# Patient Record
Sex: Female | Born: 1955 | Race: White | Hispanic: No | Marital: Married | Smoking: Never smoker
Health system: Southern US, Community
[De-identification: ages and names within clinical notes are randomized; demographics above are authoritative.]

## PROBLEM LIST (undated history)

## (undated) DIAGNOSIS — K219 Gastro-esophageal reflux disease without esophagitis: Secondary | ICD-10-CM

## (undated) DIAGNOSIS — K760 Fatty (change of) liver, not elsewhere classified: Secondary | ICD-10-CM

## (undated) DIAGNOSIS — M109 Gout, unspecified: Secondary | ICD-10-CM

## (undated) DIAGNOSIS — E559 Vitamin D deficiency, unspecified: Secondary | ICD-10-CM

## (undated) DIAGNOSIS — R011 Cardiac murmur, unspecified: Secondary | ICD-10-CM

## (undated) DIAGNOSIS — D126 Benign neoplasm of colon, unspecified: Secondary | ICD-10-CM

## (undated) DIAGNOSIS — I509 Heart failure, unspecified: Secondary | ICD-10-CM

## (undated) DIAGNOSIS — G43909 Migraine, unspecified, not intractable, without status migrainosus: Secondary | ICD-10-CM

## (undated) DIAGNOSIS — I351 Nonrheumatic aortic (valve) insufficiency: Secondary | ICD-10-CM

## (undated) DIAGNOSIS — F419 Anxiety disorder, unspecified: Secondary | ICD-10-CM

## (undated) DIAGNOSIS — R7303 Prediabetes: Secondary | ICD-10-CM

## (undated) DIAGNOSIS — I10 Essential (primary) hypertension: Secondary | ICD-10-CM

## (undated) DIAGNOSIS — E876 Hypokalemia: Secondary | ICD-10-CM

## (undated) DIAGNOSIS — M25473 Effusion, unspecified ankle: Secondary | ICD-10-CM

## (undated) DIAGNOSIS — M1990 Unspecified osteoarthritis, unspecified site: Secondary | ICD-10-CM

## (undated) HISTORY — DX: Effusion, unspecified ankle: M25.473

## (undated) HISTORY — DX: Unspecified osteoarthritis, unspecified site: M19.90

## (undated) HISTORY — DX: Anxiety disorder, unspecified: F41.9

## (undated) HISTORY — DX: Essential (primary) hypertension: I10

## (undated) HISTORY — DX: Prediabetes: R73.03

## (undated) HISTORY — DX: Benign neoplasm of colon, unspecified: D12.6

## (undated) HISTORY — DX: Heart failure, unspecified: I50.9

## (undated) HISTORY — DX: Hypokalemia: E87.6

## (undated) HISTORY — DX: Gout, unspecified: M10.9

## (undated) HISTORY — DX: Vitamin D deficiency, unspecified: E55.9

## (undated) HISTORY — DX: Gastro-esophageal reflux disease without esophagitis: K21.9

## (undated) HISTORY — DX: Cardiac murmur, unspecified: R01.1

## (undated) HISTORY — DX: Migraine, unspecified, not intractable, without status migrainosus: G43.909

## (undated) HISTORY — DX: Fatty (change of) liver, not elsewhere classified: K76.0

---

## 2000-02-09 HISTORY — PX: CHOLECYSTECTOMY: SHX55

## 2004-08-28 ENCOUNTER — Ambulatory Visit: Payer: Self-pay | Admitting: Family Medicine

## 2004-11-06 ENCOUNTER — Ambulatory Visit: Payer: Self-pay | Admitting: Family Medicine

## 2005-09-16 ENCOUNTER — Ambulatory Visit: Payer: Self-pay | Admitting: Family Medicine

## 2006-03-30 ENCOUNTER — Ambulatory Visit: Payer: Self-pay | Admitting: Gastroenterology

## 2008-03-07 ENCOUNTER — Ambulatory Visit: Payer: Self-pay | Admitting: Obstetrics and Gynecology

## 2008-03-11 ENCOUNTER — Ambulatory Visit: Payer: Self-pay | Admitting: Obstetrics and Gynecology

## 2008-03-11 HISTORY — PX: ABDOMINAL HYSTERECTOMY: SHX81

## 2009-02-26 ENCOUNTER — Ambulatory Visit: Payer: Self-pay | Admitting: Urology

## 2009-03-10 ENCOUNTER — Ambulatory Visit: Payer: Self-pay | Admitting: Urology

## 2009-03-10 ENCOUNTER — Ambulatory Visit: Payer: Self-pay

## 2012-11-26 DIAGNOSIS — I10 Essential (primary) hypertension: Secondary | ICD-10-CM | POA: Insufficient documentation

## 2013-04-06 ENCOUNTER — Ambulatory Visit: Payer: Self-pay | Admitting: Physician Assistant

## 2013-04-06 LAB — URINALYSIS, COMPLETE
Blood: NEGATIVE
Glucose,UR: NEGATIVE mg/dL (ref 0–75)
Ketone: NEGATIVE
Leukocyte Esterase: NEGATIVE
NITRITE: NEGATIVE
Ph: 6 (ref 4.5–8.0)
Protein: NEGATIVE
Specific Gravity: >=1.03 (ref 1.003–1.030)

## 2014-11-28 DIAGNOSIS — I119 Hypertensive heart disease without heart failure: Secondary | ICD-10-CM | POA: Insufficient documentation

## 2015-03-06 DIAGNOSIS — Z86010 Personal history of colonic polyps: Secondary | ICD-10-CM | POA: Insufficient documentation

## 2015-03-06 DIAGNOSIS — Z860101 Personal history of adenomatous and serrated colon polyps: Secondary | ICD-10-CM | POA: Insufficient documentation

## 2015-09-17 DIAGNOSIS — K648 Other hemorrhoids: Secondary | ICD-10-CM | POA: Insufficient documentation

## 2015-09-17 DIAGNOSIS — N816 Rectocele: Secondary | ICD-10-CM | POA: Insufficient documentation

## 2015-09-17 HISTORY — DX: Other hemorrhoids: K64.8

## 2016-04-20 DIAGNOSIS — K5904 Chronic idiopathic constipation: Secondary | ICD-10-CM | POA: Insufficient documentation

## 2016-04-29 DIAGNOSIS — R3121 Asymptomatic microscopic hematuria: Secondary | ICD-10-CM | POA: Insufficient documentation

## 2018-01-11 ENCOUNTER — Other Ambulatory Visit: Payer: Self-pay

## 2018-01-26 ENCOUNTER — Encounter: Payer: Self-pay | Admitting: Vascular Surgery

## 2018-03-09 DIAGNOSIS — E782 Mixed hyperlipidemia: Secondary | ICD-10-CM | POA: Insufficient documentation

## 2018-12-10 HISTORY — PX: ANUS SURGERY: SHX302

## 2020-09-16 DIAGNOSIS — Z803 Family history of malignant neoplasm of breast: Secondary | ICD-10-CM | POA: Insufficient documentation

## 2020-09-16 DIAGNOSIS — G43109 Migraine with aura, not intractable, without status migrainosus: Secondary | ICD-10-CM | POA: Insufficient documentation

## 2021-01-20 LAB — HM COLONOSCOPY

## 2021-05-22 DIAGNOSIS — M47812 Spondylosis without myelopathy or radiculopathy, cervical region: Secondary | ICD-10-CM | POA: Insufficient documentation

## 2021-05-25 ENCOUNTER — Other Ambulatory Visit: Payer: Self-pay | Admitting: Surgery

## 2021-05-25 DIAGNOSIS — M7581 Other shoulder lesions, right shoulder: Secondary | ICD-10-CM

## 2021-05-25 DIAGNOSIS — M25511 Pain in right shoulder: Secondary | ICD-10-CM

## 2021-05-25 DIAGNOSIS — M75111 Incomplete rotator cuff tear or rupture of right shoulder, not specified as traumatic: Secondary | ICD-10-CM

## 2021-06-01 ENCOUNTER — Other Ambulatory Visit: Payer: Self-pay | Admitting: Surgery

## 2021-06-01 DIAGNOSIS — M47812 Spondylosis without myelopathy or radiculopathy, cervical region: Secondary | ICD-10-CM

## 2021-06-01 DIAGNOSIS — M7581 Other shoulder lesions, right shoulder: Secondary | ICD-10-CM

## 2021-06-05 ENCOUNTER — Other Ambulatory Visit: Payer: Self-pay

## 2021-06-06 ENCOUNTER — Other Ambulatory Visit: Payer: Self-pay

## 2021-06-06 ENCOUNTER — Other Ambulatory Visit: Payer: BC Managed Care – PPO

## 2021-06-12 ENCOUNTER — Ambulatory Visit
Admission: RE | Admit: 2021-06-12 | Discharge: 2021-06-12 | Disposition: A | Discharge status: Home / Self Care | Payer: BC Managed Care – PPO | Source: Ambulatory Visit | Attending: Surgery | Admitting: Surgery

## 2021-06-12 ENCOUNTER — Ambulatory Visit
Admission: RE | Admit: 2021-06-12 | Discharge: 2021-06-12 | Disposition: A | Discharge status: Home / Self Care | Payer: Self-pay | Source: Ambulatory Visit | Attending: Surgery | Admitting: Surgery

## 2021-06-12 DIAGNOSIS — M7581 Other shoulder lesions, right shoulder: Secondary | ICD-10-CM

## 2021-06-12 DIAGNOSIS — M75111 Incomplete rotator cuff tear or rupture of right shoulder, not specified as traumatic: Secondary | ICD-10-CM

## 2021-06-12 DIAGNOSIS — M25511 Pain in right shoulder: Secondary | ICD-10-CM

## 2021-06-12 DIAGNOSIS — M47812 Spondylosis without myelopathy or radiculopathy, cervical region: Secondary | ICD-10-CM

## 2021-09-11 ENCOUNTER — Other Ambulatory Visit: Payer: Self-pay | Admitting: Surgery

## 2021-10-05 ENCOUNTER — Other Ambulatory Visit: Payer: BC Managed Care – PPO

## 2021-10-22 LAB — HM MAMMOGRAPHY

## 2021-11-17 ENCOUNTER — Telehealth: Payer: Self-pay

## 2021-11-17 NOTE — Telephone Encounter (Signed)
Noted  KP 

## 2021-11-17 NOTE — Telephone Encounter (Signed)
Copied from [place] [phone]. Topic: Appointment Scheduling - Scheduling Inquiry for Clinic >> Nov 17, 2021  9:05 AM Tiffany B wrote: Reason for CRM: Patient would like to know if PCP can possible work her in prior to her rotator cuff surgery with Poggi, Marshall Cork, MD at [facility] clinic scheduled for 02/11/2021.   Patient was referred by an exisiting patient of Dr. Army Melia. Inform patient she is on the wait list and if there's any cancellations we will reach out.

## 2021-12-14 ENCOUNTER — Ambulatory Visit: Payer: Self-pay

## 2021-12-18 LAB — BASIC METABOLIC PANEL
BUN: 16 (ref 4–21)
CO2: 33 — AB (ref 13–22)
Chloride: 101 (ref 99–108)
Creatinine: 0.8 (ref 0.5–1.1)
Glucose: 95
Potassium: 3.6 mEq/L (ref 3.5–5.1)
Sodium: 141 (ref 137–147)

## 2021-12-18 LAB — COMPREHENSIVE METABOLIC PANEL
Calcium: 10 (ref 8.7–10.7)
eGFR: 84

## 2022-01-21 ENCOUNTER — Other Ambulatory Visit: Payer: Self-pay | Admitting: Surgery

## 2022-02-03 ENCOUNTER — Encounter: Payer: Self-pay | Admitting: *Deleted

## 2022-02-03 ENCOUNTER — Encounter
Admission: RE | Admit: 2022-02-03 | Discharge: 2022-02-03 | Disposition: A | Discharge status: Home / Self Care | Payer: BC Managed Care – PPO | Source: Ambulatory Visit | Attending: Surgery | Admitting: Surgery

## 2022-02-03 NOTE — Patient Instructions (Addendum)
Your procedure is scheduled on: Thursday February 11, 2022. Report to Day Surgery inside [facility] 2nd floor, stop by registration desk before getting on elevator.  To find out your arrival time please call [phone] between 1PM - 3PM on Wednesday February 10, 2022.  Remember: Instructions that are not followed completely may result in serious medical risk,  up to and including death, or upon the discretion of your surgeon and anesthesiologist your  surgery may need to be rescheduled.     _X__ 1. Do not eat food after midnight the night before your procedure.                 No chewing gum or hard candies. You may drink clear liquids up to 2 hours                 before you are scheduled to arrive for your surgery- DO not drink clear                 liquids within 2 hours of the start of your surgery.                 Clear Liquids include:  water, apple juice without pulp, Black Coffee or Tea (Do not add                 anything to coffee or tea).  __X__2.   Complete the "Ensure Clear Pre-surgery Clear Carbohydrate Drink" provided to you, 2 hours before arrival. **If you are diabetic you will be provided with an alternative drink, Gatorade Zero or G2.  __X__3.  On the morning of surgery brush your teeth with toothpaste and water, you                may rinse your mouth with mouthwash if you wish.  Do not swallow any toothpaste or mouthwash.     _X__ 4.  No Alcohol for 24 hours before or after surgery.   _X__ 5.  Do Not Smoke or use e-cigarettes For 24 Hours Prior to Your Surgery.                 Do not use any chewable tobacco products for at least 6 hours prior to                 Surgery.  _X__  6.  Do not use any recreational drugs (marijuana, cocaine, heroin, ecstasy, MDMA or other)                For at least one week prior to your surgery.  Combination of these drugs with anesthesia                May have life threatening results.  ____  7.  Bring all  medications with you on the day of surgery if instructed.   __X__ 8.  Notify your doctor if there is any change in your medical condition      (cold, fever, infections).     Do not wear jewelry, make-up, hairpins, clips or nail polish. Do not wear lotions, powders, or perfumes.  Do not shave 48 hours prior to surgery.  Do not bring valuables to the hospital.    [facility] is not responsible for any belongings or valuables.  Contacts, dentures or bridgework may not be worn into surgery. Leave your suitcase in the car. After surgery it may be brought to your room. For patients admitted to the hospital, discharge time  is determined by your treatment team.   Patients discharged the day of surgery will not be allowed to drive home.   Make arrangements for someone to be with you for the first 24 hours of your Same Day Discharge.   _X___ Take these medicines the morning of surgery with A SIP OF WATER:    1. None    2.   3.   4.  5.  6.  ____ Fleet Enema (as directed)   __X__ Use CHG Soap (or wipes) as directed  ____ Use Benzoyl Peroxide Gel as instructed  ____ Use inhalers on the day of surgery  ____ Stop metformin 2 days prior to surgery    ____ Take 1/2 of usual insulin dose the night before surgery. No insulin the morning          of surgery.   ____ Call your PCP, cardiologist, or Pulmonologist if taking Coumadin/Plavix/aspirin and ask when to stop before your surgery.   __X__ One Week prior to surgery- Stop Anti-inflammatories such as Ibuprofen, Aleve, Advil, Motrin, meloxicam (MOBIC), diclofenac, etodolac, ketorolac, Toradol, Daypro, piroxicam, Goody's or BC powders. OK TO USE TYLENOL IF NEEDED   __X__ Stop vitamins and supplements until after surgery.    ____ Bring C-Pap to the hospital.    If you have any questions regarding your pre-procedure instructions,  Please call Pre-admit Testing at [phone]    Preparing for Surgery with CHLORHEXIDINE GLUCONATE  (CHG) Soap  Chlorhexidine Gluconate (CHG) Soap  o An antiseptic cleaner that kills germs and bonds with the skin to continue killing germs even after washing  o Used for showering the night before surgery and morning of surgery  Before surgery, you can play an important role by reducing the number of germs on your skin.  CHG (Chlorhexidine gluconate) soap is an antiseptic cleanser which kills germs and bonds with the skin to continue killing germs even after washing.  Please do not use if you have an allergy to CHG or antibacterial soaps. If your skin becomes reddened/irritated stop using the CHG.  1. Shower the NIGHT BEFORE SURGERY and the MORNING OF SURGERY with CHG soap.  2. If you choose to wash your hair, wash your hair first as usual with your normal shampoo.  3. After shampooing, rinse your hair and body thoroughly to remove the shampoo.  4. Use CHG as you would any other liquid soap. You can apply CHG directly to the skin and wash gently with a scrungie or a clean washcloth.  5. Apply the CHG soap to your body only from the neck down. Do not use on open wounds or open sores. Avoid contact with your eyes, ears, mouth, and genitals (private parts). Wash face and genitals (private parts) with your normal soap.  6. Wash thoroughly, paying special attention to the area where your surgery will be performed.  7. Thoroughly rinse your body with warm water.  8. Do not shower/wash with your normal soap after using and rinsing off the CHG soap.  9. Pat yourself dry with a clean towel.  10. Wear clean pajamas to bed the night before surgery.  12. Place clean sheets on your bed the night of your first shower and do not sleep with pets.  13. Shower again with the CHG soap on the day of surgery prior to arriving at the hospital.  14. Do not apply any deodorants/lotions/powders.  15. Please wear clean clothes to the hospital.

## 2022-02-10 MED ORDER — LACTATED RINGERS IV SOLN: INTRAVENOUS | Status: DC

## 2022-02-10 MED ORDER — ORAL CARE MOUTH RINSE: 15.0000 mL | Freq: Once | OROMUCOSAL | Status: AC

## 2022-02-10 MED ORDER — CEFAZOLIN SODIUM-DEXTROSE 2-4 GM/100ML-% IV SOLN: 2.0000 g | INTRAVENOUS | Status: DC

## 2022-02-10 MED ORDER — CHLORHEXIDINE GLUCONATE 0.12 % MT SOLN: 15.0000 mL | Freq: Once | OROMUCOSAL | Status: AC

## 2022-02-10 MED ORDER — FAMOTIDINE 20 MG PO TABS: 20.0000 mg | ORAL_TABLET | Freq: Once | ORAL | Status: AC

## 2022-02-11 ENCOUNTER — Other Ambulatory Visit: Payer: Self-pay

## 2022-02-11 ENCOUNTER — Encounter
Admission: RE | Disposition: A | Discharge status: Home / Self Care | Payer: Self-pay | Source: Home / Self Care | Attending: Surgery

## 2022-02-11 ENCOUNTER — Encounter: Payer: Self-pay | Admitting: Surgery

## 2022-02-11 ENCOUNTER — Ambulatory Visit: Payer: PPO

## 2022-02-11 ENCOUNTER — Ambulatory Visit: Payer: PPO | Admitting: Anesthesiology

## 2022-02-11 ENCOUNTER — Ambulatory Visit
Admission: RE | Admit: 2022-02-11 | Discharge: 2022-02-11 | Disposition: A | Discharge status: Home / Self Care | Payer: PPO | Attending: Surgery | Admitting: Surgery

## 2022-02-11 DIAGNOSIS — K760 Fatty (change of) liver, not elsewhere classified: Secondary | ICD-10-CM | POA: Insufficient documentation

## 2022-02-11 DIAGNOSIS — M7521 Bicipital tendinitis, right shoulder: Secondary | ICD-10-CM | POA: Insufficient documentation

## 2022-02-11 DIAGNOSIS — Z6833 Body mass index (BMI) 33.0-33.9, adult: Secondary | ICD-10-CM | POA: Diagnosis not present

## 2022-02-11 DIAGNOSIS — E669 Obesity, unspecified: Secondary | ICD-10-CM | POA: Diagnosis not present

## 2022-02-11 DIAGNOSIS — I110 Hypertensive heart disease with heart failure: Secondary | ICD-10-CM | POA: Diagnosis not present

## 2022-02-11 DIAGNOSIS — M75111 Incomplete rotator cuff tear or rupture of right shoulder, not specified as traumatic: Secondary | ICD-10-CM | POA: Insufficient documentation

## 2022-02-11 DIAGNOSIS — M25811 Other specified joint disorders, right shoulder: Secondary | ICD-10-CM | POA: Diagnosis not present

## 2022-02-11 DIAGNOSIS — M7581 Other shoulder lesions, right shoulder: Secondary | ICD-10-CM | POA: Diagnosis not present

## 2022-02-11 DIAGNOSIS — S43431A Superior glenoid labrum lesion of right shoulder, initial encounter: Secondary | ICD-10-CM | POA: Insufficient documentation

## 2022-02-11 DIAGNOSIS — K219 Gastro-esophageal reflux disease without esophagitis: Secondary | ICD-10-CM | POA: Diagnosis not present

## 2022-02-11 DIAGNOSIS — I509 Heart failure, unspecified: Secondary | ICD-10-CM | POA: Diagnosis not present

## 2022-02-11 DIAGNOSIS — G8918 Other acute postprocedural pain: Secondary | ICD-10-CM | POA: Diagnosis not present

## 2022-02-11 HISTORY — PX: SHOULDER ARTHROSCOPY WITH SUBACROMIAL DECOMPRESSION, ROTATOR CUFF REPAIR AND BICEP TENDON REPAIR: SHX5687

## 2022-02-11 SURGERY — SHOULDER ARTHROSCOPY WITH SUBACROMIAL DECOMPRESSION, ROTATOR CUFF REPAIR AND BICEP TENDON REPAIR
Anesthesia: Regional | Site: Shoulder | Laterality: Right

## 2022-02-11 MED ORDER — LIDOCAINE HCL (PF) 2 % IJ SOLN
INTRAMUSCULAR | Status: AC
  Filled 2022-02-11: qty 5

## 2022-02-11 MED ORDER — SODIUM CHLORIDE 0.9 % IV SOLN: INTRAVENOUS | Status: DC

## 2022-02-11 MED ORDER — LACTATED RINGERS IV SOLN: INTRAVENOUS | Status: DC

## 2022-02-11 MED ORDER — FENTANYL CITRATE PF 50 MCG/ML IJ SOSY
PREFILLED_SYRINGE | INTRAMUSCULAR | Status: AC
  Administered 2022-02-11: 50 ug via INTRAVENOUS
  Filled 2022-02-11: qty 1

## 2022-02-11 MED ORDER — PHENYLEPHRINE HCL-NACL 20-0.9 MG/250ML-% IV SOLN
INTRAVENOUS | Status: DC | PRN
  Administered 2022-02-11: 20 ug/min via INTRAVENOUS

## 2022-02-11 MED ORDER — KETOROLAC TROMETHAMINE 15 MG/ML IJ SOLN: 15.0000 mg | Freq: Once | INTRAMUSCULAR | Status: DC

## 2022-02-11 MED ORDER — OXYCODONE HCL 5 MG PO TABS
5.0000 mg | ORAL_TABLET | Freq: Once | ORAL | Status: AC | PRN
  Administered 2022-02-11: 5 mg via ORAL

## 2022-02-11 MED ORDER — FENTANYL CITRATE (PF) 100 MCG/2ML IJ SOLN: 25.0000 ug | INTRAMUSCULAR | Status: DC | PRN

## 2022-02-11 MED ORDER — ROCURONIUM BROMIDE 100 MG/10ML IV SOLN
INTRAVENOUS | Status: DC | PRN
  Administered 2022-02-11: 50 mg via INTRAVENOUS

## 2022-02-11 MED ORDER — ONDANSETRON HCL 4 MG/2ML IJ SOLN: 4.0000 mg | Freq: Four times a day (QID) | INTRAMUSCULAR | Status: DC | PRN

## 2022-02-11 MED ORDER — ACETAMINOPHEN 10 MG/ML IV SOLN: 1000.0000 mg | Freq: Once | INTRAVENOUS | Status: DC | PRN

## 2022-02-11 MED ORDER — CEFAZOLIN SODIUM-DEXTROSE 2-4 GM/100ML-% IV SOLN
INTRAVENOUS | Status: AC
  Filled 2022-02-11: qty 100

## 2022-02-11 MED ORDER — BUPIVACAINE HCL (PF) 0.5 % IJ SOLN
INTRAMUSCULAR | Status: AC
  Filled 2022-02-11: qty 10

## 2022-02-11 MED ORDER — EPINEPHRINE PF 1 MG/ML IJ SOLN
INTRAMUSCULAR | Status: AC
  Filled 2022-02-11: qty 2

## 2022-02-11 MED ORDER — OXYCODONE HCL 5 MG PO TABS: 5.0000 mg | ORAL_TABLET | ORAL | 0 refills | Status: DC | PRN

## 2022-02-11 MED ORDER — MIDAZOLAM HCL 2 MG/2ML IJ SOLN
INTRAMUSCULAR | Status: AC
  Administered 2022-02-11: 1 mg via INTRAVENOUS
  Filled 2022-02-11: qty 2

## 2022-02-11 MED ORDER — DEXAMETHASONE SODIUM PHOSPHATE 10 MG/ML IJ SOLN
INTRAMUSCULAR | Status: AC
  Filled 2022-02-11: qty 1

## 2022-02-11 MED ORDER — ONDANSETRON HCL 4 MG/2ML IJ SOLN
INTRAMUSCULAR | Status: DC | PRN
  Administered 2022-02-11: 4 mg via INTRAVENOUS

## 2022-02-11 MED ORDER — ONDANSETRON HCL 4 MG PO TABS: 4.0000 mg | ORAL_TABLET | Freq: Four times a day (QID) | ORAL | Status: DC | PRN

## 2022-02-11 MED ORDER — MIDAZOLAM HCL 2 MG/2ML IJ SOLN
1.0000 mg | INTRAMUSCULAR | Status: AC | PRN
  Administered 2022-02-11: 1 mg via INTRAVENOUS

## 2022-02-11 MED ORDER — KETOROLAC TROMETHAMINE 30 MG/ML IJ SOLN
INTRAMUSCULAR | Status: AC
  Filled 2022-02-11: qty 1

## 2022-02-11 MED ORDER — ACETAMINOPHEN 10 MG/ML IV SOLN
INTRAVENOUS | Status: DC | PRN
  Administered 2022-02-11: 1000 mg via INTRAVENOUS

## 2022-02-11 MED ORDER — FENTANYL CITRATE (PF) 100 MCG/2ML IJ SOLN
INTRAMUSCULAR | Status: DC | PRN
  Administered 2022-02-11: 50 ug via INTRAVENOUS

## 2022-02-11 MED ORDER — ONDANSETRON HCL 4 MG/2ML IJ SOLN
INTRAMUSCULAR | Status: AC
  Filled 2022-02-11: qty 2

## 2022-02-11 MED ORDER — ONDANSETRON 4 MG PO TBDP: 4.0000 mg | ORAL_TABLET | Freq: Three times a day (TID) | ORAL | 1 refills | Status: DC | PRN

## 2022-02-11 MED ORDER — KETOROLAC TROMETHAMINE 30 MG/ML IJ SOLN
INTRAMUSCULAR | Status: DC | PRN
  Administered 2022-02-11: 15 mg via INTRAVENOUS

## 2022-02-11 MED ORDER — PROPOFOL 10 MG/ML IV BOLUS
INTRAVENOUS | Status: AC
  Filled 2022-02-11: qty 20

## 2022-02-11 MED ORDER — OXYCODONE HCL 5 MG PO TABS
ORAL_TABLET | ORAL | Status: AC
  Filled 2022-02-11: qty 1

## 2022-02-11 MED ORDER — FENTANYL CITRATE PF 50 MCG/ML IJ SOSY: 50.0000 ug | PREFILLED_SYRINGE | Freq: Once | INTRAMUSCULAR | Status: AC

## 2022-02-11 MED ORDER — PHENYLEPHRINE HCL (PRESSORS) 10 MG/ML IV SOLN
INTRAVENOUS | Status: AC
  Filled 2022-02-11: qty 1

## 2022-02-11 MED ORDER — SUGAMMADEX SODIUM 200 MG/2ML IV SOLN
INTRAVENOUS | Status: DC | PRN
  Administered 2022-02-11: 200 mg via INTRAVENOUS

## 2022-02-11 MED ORDER — METOCLOPRAMIDE HCL 5 MG/ML IJ SOLN: 5.0000 mg | Freq: Three times a day (TID) | INTRAMUSCULAR | Status: DC | PRN

## 2022-02-11 MED ORDER — ONDANSETRON HCL 4 MG/2ML IJ SOLN
4.0000 mg | Freq: Once | INTRAMUSCULAR | Status: AC | PRN
  Administered 2022-02-11: 4 mg via INTRAVENOUS

## 2022-02-11 MED ORDER — DEXAMETHASONE SODIUM PHOSPHATE 10 MG/ML IJ SOLN
INTRAMUSCULAR | Status: DC | PRN
  Administered 2022-02-11: 10 mg via INTRAVENOUS

## 2022-02-11 MED ORDER — BUPIVACAINE HCL (PF) 0.5 % IJ SOLN
INTRAMUSCULAR | Status: DC | PRN
  Administered 2022-02-11: 10 mL

## 2022-02-11 MED ORDER — METOCLOPRAMIDE HCL 10 MG PO TABS: 5.0000 mg | ORAL_TABLET | Freq: Three times a day (TID) | ORAL | Status: DC | PRN

## 2022-02-11 MED ORDER — GLYCOPYRROLATE 0.2 MG/ML IJ SOLN
INTRAMUSCULAR | Status: DC | PRN
  Administered 2022-02-11: .2 mg via INTRAVENOUS

## 2022-02-11 MED ORDER — OXYCODONE HCL 5 MG PO TABS: 5.0000 mg | ORAL_TABLET | ORAL | Status: DC | PRN

## 2022-02-11 MED ORDER — LACTATED RINGERS IV SOLN
INTRAVENOUS | Status: DC | PRN
  Administered 2022-02-11: 2501 mL

## 2022-02-11 MED ORDER — BUPIVACAINE LIPOSOME 1.3 % IJ SUSP
INTRAMUSCULAR | Status: DC | PRN
  Administered 2022-02-11: 20 mL

## 2022-02-11 MED ORDER — CHLORHEXIDINE GLUCONATE 0.12 % MT SOLN
OROMUCOSAL | Status: AC
  Administered 2022-02-11: 15 mL via OROMUCOSAL
  Filled 2022-02-11: qty 15

## 2022-02-11 MED ORDER — FAMOTIDINE 20 MG PO TABS
ORAL_TABLET | ORAL | Status: AC
  Administered 2022-02-11: 20 mg via ORAL
  Filled 2022-02-11: qty 1

## 2022-02-11 MED ORDER — CEFAZOLIN SODIUM-DEXTROSE 2-4 GM/100ML-% IV SOLN
2.0000 g | INTRAVENOUS | Status: AC
  Administered 2022-02-11: 2 g via INTRAVENOUS

## 2022-02-11 MED ORDER — EPHEDRINE SULFATE (PRESSORS) 50 MG/ML IJ SOLN
INTRAMUSCULAR | Status: DC | PRN
  Administered 2022-02-11: 5 mg via INTRAVENOUS
  Administered 2022-02-11 (×2): 10 mg via INTRAVENOUS

## 2022-02-11 MED ORDER — OXYCODONE HCL 5 MG/5ML PO SOLN: 5.0000 mg | Freq: Once | ORAL | Status: AC | PRN

## 2022-02-11 MED ORDER — LIDOCAINE HCL (CARDIAC) PF 100 MG/5ML IV SOSY
PREFILLED_SYRINGE | INTRAVENOUS | Status: DC | PRN
  Administered 2022-02-11: 100 mg via INTRAVENOUS

## 2022-02-11 MED ORDER — BUPIVACAINE LIPOSOME 1.3 % IJ SUSP
INTRAMUSCULAR | Status: AC
  Filled 2022-02-11: qty 20

## 2022-02-11 MED ORDER — BUPIVACAINE-EPINEPHRINE 0.5% -1:200000 IJ SOLN
INTRAMUSCULAR | Status: DC | PRN
  Administered 2022-02-11: 30 mL

## 2022-02-11 MED ORDER — FENTANYL CITRATE (PF) 100 MCG/2ML IJ SOLN
INTRAMUSCULAR | Status: AC
  Filled 2022-02-11: qty 2

## 2022-02-11 MED ORDER — PROPOFOL 10 MG/ML IV BOLUS
INTRAVENOUS | Status: DC | PRN
  Administered 2022-02-11: 150 mg via INTRAVENOUS

## 2022-02-11 MED ORDER — ROCURONIUM BROMIDE 10 MG/ML (PF) SYRINGE
PREFILLED_SYRINGE | INTRAVENOUS | Status: AC
  Filled 2022-02-11: qty 10

## 2022-02-11 MED ORDER — EPHEDRINE 5 MG/ML INJ
INTRAVENOUS | Status: AC
  Filled 2022-02-11: qty 10

## 2022-02-11 MED ORDER — BUPIVACAINE-EPINEPHRINE (PF) 0.5% -1:200000 IJ SOLN
INTRAMUSCULAR | Status: AC
  Filled 2022-02-11: qty 30

## 2022-02-11 SURGICAL SUPPLY — 56 items
ANCH SUT 2 JK 1.5X2.9 2 LD (Anchor) ×2 IMPLANT
ANCH SUT 5.5 KNTLS (Anchor) ×1 IMPLANT
ANCHOR HEALICOIL REGEN 5.5 (Anchor) IMPLANT
ANCHOR SUT JK SZ 2 2.9 DBL SL (Anchor) IMPLANT
APL PRP STRL LF DISP 70% ISPRP (MISCELLANEOUS) ×2
BIT DRILL JUGRKNT W/NDL BIT2.9 (DRILL) IMPLANT
BLADE FULL RADIUS 3.5 (BLADE) ×1 IMPLANT
BUR ACROMIONIZER 4.0 (BURR) ×1 IMPLANT
CANNULA SHAVER 8MMX76MM (CANNULA) ×1 IMPLANT
CHLORAPREP W/TINT 26 (MISCELLANEOUS) ×1 IMPLANT
COOLER POLAR GLACIER W/PUMP (MISCELLANEOUS) IMPLANT
COVER MAYO STAND REUSABLE (DRAPES) ×1 IMPLANT
DILATOR 5.5 THREADED HEALICOIL (MISCELLANEOUS) IMPLANT
DRILL JUGGERKNOT W/NDL BIT 2.9 (DRILL) ×1
ELECT CAUTERY BLADE 6.4 (BLADE) ×1 IMPLANT
ELECT REM PT RETURN 9FT ADLT (ELECTROSURGICAL) ×1
ELECTRODE REM PT RTRN 9FT ADLT (ELECTROSURGICAL) ×1 IMPLANT
GAUZE SPONGE 4X4 12PLY STRL (GAUZE/BANDAGES/DRESSINGS) ×1 IMPLANT
GAUZE XEROFORM 1X8 LF (GAUZE/BANDAGES/DRESSINGS) ×1 IMPLANT
GLOVE BIO SURGEON STRL SZ7.5 (GLOVE) ×2 IMPLANT
GLOVE BIO SURGEON STRL SZ8 (GLOVE) ×2 IMPLANT
GLOVE BIOGEL PI IND STRL 8 (GLOVE) ×1 IMPLANT
GLOVE SURG UNDER LTX SZ8 (GLOVE) ×1 IMPLANT
GOWN STRL REUS W/ TWL LRG LVL3 (GOWN DISPOSABLE) ×1 IMPLANT
GOWN STRL REUS W/ TWL XL LVL3 (GOWN DISPOSABLE) ×1 IMPLANT
GOWN STRL REUS W/TWL LRG LVL3 (GOWN DISPOSABLE) ×1
GOWN STRL REUS W/TWL XL LVL3 (GOWN DISPOSABLE) ×1
GRASPER SUT 15 45D LOW PRO (SUTURE) IMPLANT
IV LACTATED RINGER IRRG 3000ML (IV SOLUTION) ×1
IV LR IRRIG 3000ML ARTHROMATIC (IV SOLUTION) ×2 IMPLANT
KIT CANNULA 8X76-LX IN CANNULA (CANNULA) ×1 IMPLANT
MANIFOLD NEPTUNE II (INSTRUMENTS) ×2 IMPLANT
MASK FACE SPIDER DISP (MASK) ×1 IMPLANT
MAT ABSORB  FLUID 56X50 GRAY (MISCELLANEOUS) ×1
MAT ABSORB FLUID 56X50 GRAY (MISCELLANEOUS) ×1 IMPLANT
PACK ARTHROSCOPY SHOULDER (MISCELLANEOUS) ×1 IMPLANT
PAD ABD DERMACEA PRESS 5X9 (GAUZE/BANDAGES/DRESSINGS) ×2 IMPLANT
PAD WRAPON POLAR SHDR UNIV (MISCELLANEOUS) IMPLANT
PASSER SUT FIRSTPASS SELF (INSTRUMENTS) IMPLANT
SLEEVE REMOTE CONTROL 5X12 (DRAPES) IMPLANT
SLING ARM LRG DEEP (SOFTGOODS) ×1 IMPLANT
SLING ULTRA II LG (MISCELLANEOUS) ×1 IMPLANT
SPONGE T-LAP 18X18 ~~LOC~~+RFID (SPONGE) ×1 IMPLANT
STAPLER SKIN PROX 35W (STAPLE) ×1 IMPLANT
STRAP SAFETY 5IN WIDE (MISCELLANEOUS) ×1 IMPLANT
SUT ETHIBOND 0 MO6 C/R (SUTURE) ×1 IMPLANT
SUT ULTRABRAID 2 COBRAID 38 (SUTURE) IMPLANT
SUT VIC AB 2-0 CT1 27 (SUTURE) ×2
SUT VIC AB 2-0 CT1 TAPERPNT 27 (SUTURE) ×2 IMPLANT
TAPE MICROFOAM 4IN (TAPE) ×1 IMPLANT
TRAP FLUID SMOKE EVACUATOR (MISCELLANEOUS) ×1 IMPLANT
TUBING CONNECTING 10 (TUBING) ×1 IMPLANT
TUBING INFLOW SET DBFLO PUMP (TUBING) ×1 IMPLANT
WAND WEREWOLF FLOW 90D (MISCELLANEOUS) ×1 IMPLANT
WATER STERILE IRR 500ML POUR (IV SOLUTION) ×1 IMPLANT
WRAPON POLAR PAD SHDR UNIV (MISCELLANEOUS) ×1

## 2022-02-11 NOTE — Op Note (Signed)
02/11/2022  3:48 PM  Patient:   Lynn Tapia  Pre-Op Diagnosis:   Impingement/tendinopathy with partial-thickness rotator cuff tear, right shoulder.  Post-Op Diagnosis:   Impingement/tendinopathy with partial-thickness rotator cuff tear, SLAP tear, and biceps tendinopathy, right shoulder.  Procedure:   Extensive arthroscopic debridement, arthroscopic subacromial decompression, mini-open rotator cuff repair, and mini-open biceps tenodesis, right shoulder.  Anesthesia:   General endotracheal with interscalene block using Exparel placed preoperatively by the anesthesiologist.  Surgeon:   Pascal Lux, MD  Assistant:   Cameron Proud, PA-C  Findings:   As above. There was a near full-thickness primarily articular sided partial-thickness tear involving the anterior and middle portions of the supraspinatus insertional fibers. The remainder of the rotator cuff was in excellent condition, other than some longitudinal partial-thickness tearing involving the superior insertional fibers of the subscapularis tendon without compromise of the footprint. There was a Type II SLAP tear extending from the 11:00 to 1:30 position, as well as more chronic extensive fraying of the anterior and superior portions of the labrum. There was moderate "lip sticking" of the biceps tendon without partial or full-thickness tearing. The articular surfaces of the glenoid and humerus both were in satisfactory condition.  Complications:   None  Fluids:   650 cc  Estimated blood loss:   10 cc  Tourniquet time:   None  Drains:   None  Closure:   Staples      Brief clinical note:   The patient is a 67 year old female with a history of gradually worsening right shoulder pain. The patient's symptoms have progressed despite medications, activity modification, etc. The patient's history and examination are consistent with impingement/tendinopathy with a probable rotator cuff tear. These findings were confirmed by MRI scan.  The patient presents at this time for definitive management of these shoulder symptoms.  Procedure:   The patient underwent placement of an interscalene block using Exparel by the anesthesiologist in the preoperative holding area before being brought into the operating room and lain in the supine position. The patient then underwent general endotracheal intubation and anesthesia before being repositioned in the beach chair position using the beach chair positioner. The right shoulder and upper extremity were prepped with ChloraPrep solution before being draped sterilely. Preoperative antibiotics were administered. A timeout was performed to confirm the proper surgical site before the expected portal sites and incision site were injected with 0.5% Sensorcaine with epinephrine.   A posterior portal was created and the glenohumeral joint thoroughly inspected with the findings as described above. An anterior portal was created using an outside-in technique. The labrum and rotator cuff were further probed, again confirming the above-noted findings. The areas of labral fraying were debrided back to stable margins using the full-radius resector. The torn portions of the supraspinatus and subscapularis tendons were debrided back to stable margins using the full-radius resector. Finally, areas of synovitis anteriorly and superiorly were debrided back to stable margins using the full-radius resector. The ArthroCare wand was inserted and used to obtain hemostasis as well as to "anneal" the labrum superiorly and anteriorly. The instruments were removed from the joint after suctioning the excess fluid.  The camera was repositioned through the posterior portal into the subacromial space. A separate lateral portal was created using an outside-in technique. The 3.5 mm full-radius resector was introduced and used to perform a subtotal bursectomy. The ArthroCare wand was then inserted and used to remove the periosteal tissue  off the undersurface of the anterior third of the acromion as  well as to recess the coracoacromial ligament from its attachment along the anterior and lateral margins of the acromion. The 4.0 mm acromionizing bur was introduced and used to complete the decompression by removing the undersurface of the anterior third of the acromion. The full radius resector was reintroduced to remove any residual bony debris before the ArthroCare wand was reintroduced to obtain hemostasis. The instruments were then removed from the subacromial space after suctioning the excess fluid.  An approximately 4-5 cm incision was made over the anterolateral aspect of the shoulder beginning at the anterolateral corner of the acromion and extending distally in line with the bicipital groove. This incision was carried down through the subcutaneous tissues to expose the deltoid fascia. The raphae between the anterior and middle thirds was identified and this plane developed to provide access into the subacromial space. Additional bursal tissues were debrided sharply using Metzenbaum scissors. The rotator cuff tear involving the supraspinatus tendon was readily identified, both visually and by palpation. The tear was completed before the margins were debrided sharply with a #15 blade and the exposed greater tuberosity roughened with a rongeur. The tear was repaired using a single Biomet 2.9 mm JuggerKnot anchor. These sutures were then brought back laterally and secured using one [place] knotless RegeneSorb anchor to create a two-layer closure. An apparent watertight closure was obtained.  The bicipital groove was identified by palpation and opened for 1-1.5 cm. The biceps tendon stump was retrieved through this defect. The floor of the bicipital groove was roughened with a curet before another Biomet 2.9 mm JuggerKnot anchor was inserted. Both sets of sutures were passed through the biceps tendon and tied securely to effect  the tenodesis. The bicipital sheath was reapproximated using two #0 Ethibond interrupted sutures, incorporating the biceps tendon to further reinforce the tenodesis.  The wound was copiously irrigated with sterile saline solution before the deltoid raphae was reapproximated using 2-0 Vicryl interrupted sutures. The subcutaneous tissues were closed in two layers using 2-0 Vicryl interrupted sutures before the skin was closed using staples. The portal sites also were closed using staples. A sterile bulky dressing was applied to the shoulder before the arm was placed into a shoulder immobilizer. A Polar Care device was applied to the shoulder. The patient was then awakened, extubated, and returned to the recovery room in satisfactory condition after tolerating the procedure well.

## 2022-02-11 NOTE — Anesthesia Procedure Notes (Addendum)
Anesthesia Regional Block: Interscalene brachial plexus block   Pre-Anesthetic Checklist: , timeout performed,  Correct Patient, Correct Site, Correct Laterality,  Correct Procedure,, risks and benefits discussed,  Surgical consent,  Pre-op evaluation,  At surgeon's request and post-op pain management  Laterality: Right  Prep: chloraprep       Needles:  Injection technique: Single-shot  Needle Type: Echogenic Needle          Additional Needles:   Procedures:,,,, ultrasound used (permanent image in chart),,   Motor weakness within 20 minutes.  Narrative:  Start time: 02/11/2022 1:03 PM End time: 02/11/2022 1:05 PM Injection made incrementally with aspirations every 5 mL.  Performed by: Personally  Anesthesiologist: Darrin Nipper, MD  Additional Notes: Functioning IV was confirmed and monitors applied.  Sterile prep and drape, hand hygiene and sterile gloves were used. Ultrasound guidance: relevant anatomy identified, needle position confirmed, local anesthetic spread visualized around nerve(s), vascular puncture avoided.  Image saved to electronic medical record.  Negative aspiration prior to incremental administration of local anesthetic for total 20 ml Exparel and 10 ml bupivacaine 0.5% given in interscalene distribution. The patient tolerated the procedure well. Vital signs and moderate sedation medications recorded in RN notes.   Update 02/11/22 at 13:36 -- pt still able to lift arm above head.  Failed block for unknown reason -- anatomy was straightforward.

## 2022-02-11 NOTE — Discharge Instructions (Addendum)
Orthopedic discharge instructions: Keep dressing dry and intact.  May shower after dressing changed on post-op day #4 (Monday).  Cover staples with Band-Aids after drying off. Apply ice frequently to shoulder. Take ibuprofen 600-800 mg TID with meals for 3-5 days, then as necessary. Take pain medication as prescribed when needed.  May supplement with ES Tylenol if necessary. Keep shoulder immobilizer on at all times except may remove for bathing purposes. Follow-up in 10-14 days or as scheduled.AMBULATORY SURGERY  DISCHARGE INSTRUCTIONS   The drugs that you were given will stay in your system until tomorrow so for the next 24 hours you should not:  Drive an automobile Make any legal decisions Drink any alcoholic beverage   You may resume regular meals tomorrow.  Today it is better to start with liquids and gradually work up to solid foods.  You may eat anything you prefer, but it is better to start with liquids, then soup and crackers, and gradually work up to solid foods.   Please notify your doctor immediately if you have any unusual bleeding, trouble breathing, redness and pain at the surgery site, drainage, fever, or pain not relieved by medication.    Additional Instructions:        Please contact your physician with any problems or Same Day Surgery at [phone], Monday through Friday 6 am to 4 pm, or [place] at [facility] number at [phone].SHOULDER SLING IMMOBILIZER   VIDEO Slingshot 2 Shoulder Brace Application - YouTube ---[email or web address]  INSTRUCTIONS While supporting the injured arm, slide the forearm into the sling. Wrap the adjustable shoulder strap around the neck and shoulders and attach the strap end to the sling using  the "alligator strap tab."  Adjust the shoulder strap to the required length. Position the shoulder pad behind the neck. To secure the shoulder pad location (optional), pull the shoulder strap away  from the shoulder pad, unfold the hook material on the top of the pad, then press the shoulder strap back onto the hook material to secure the pad in place. Attach the closure strap across the open top of the sling. Position the strap so that it holds the arm securely in the sling. Next, attach the thumb strap to the open end of the sling between the thumb and fingers. After sling has been fit, it may be easily removed and reapplied using the quick release buckle on shoulder strap. If a neutral pillow or 15 abduction pillow is included, place the pillow at the waistline. Attach the sling to the pillow, lining up hook material on the pillow with the loop on sling. Adjust the waist strap to fit.  If waist strap is too long, cut it to fit. Use the small piece of double sided hook material (located on top of the pillow) to secure the strap end. Place the double sided hook material on the inside of the cut strap end and secure it to the waist strap.     If no pillow is included, attach the waist strap to the sling and adjust to fit.    Washing Instructions: Straps and sling must be removed and cleaned regularly depending on your activity level and perspiration. Hand wash straps and sling in cold water with mild detergent, rinse, air dry   SHOULDER SLING IMMOBILIZER   VIDEO Slingshot 2 Shoulder Brace Application - YouTube ---[email or web address]  INSTRUCTIONS While supporting the injured arm, slide the forearm into the sling. Wrap the adjustable shoulder strap around the neck and shoulders and attach  the strap end to the sling using  the "alligator strap tab."  Adjust the shoulder strap to the required length. Position the shoulder pad behind the neck. To secure the shoulder pad location (optional), pull the shoulder strap away from the shoulder pad, unfold the hook material on the top of the pad, then press the shoulder strap back onto the hook material to secure the pad in  place. Attach the closure strap across the open top of the sling. Position the strap so that it holds the arm securely in the sling. Next, attach the thumb strap to the open end of the sling between the thumb and fingers. After sling has been fit, it may be easily removed and reapplied using the quick release buckle on shoulder strap. If a neutral pillow or 15 abduction pillow is included, place the pillow at the waistline. Attach the sling to the pillow, lining up hook material on the pillow with the loop on sling. Adjust the waist strap to fit.  If waist strap is too long, cut it to fit. Use the small piece of double sided hook material (located on top of the pillow) to secure the strap end. Place the double sided hook material on the inside of the cut strap end and secure it to the waist strap.     If no pillow is included, attach the waist strap to the sling and adjust to fit.    Washing Instructions: Straps and sling must be removed and cleaned regularly depending on your activity level and perspiration. Hand wash straps and sling in cold water with mild detergent, rinse, air dry

## 2022-02-11 NOTE — Anesthesia Preprocedure Evaluation (Addendum)
Anesthesia Evaluation  Patient identified by MRN, date of birth, ID band Patient awake    Reviewed: Allergy & Precautions, NPO status , Patient's Chart, lab work & pertinent test results  History of Anesthesia Complications Negative for: history of anesthetic complications  Airway Mallampati: II   Neck ROM: Full    Dental  (+) Missing   Pulmonary neg pulmonary ROS   Pulmonary exam normal breath sounds clear to auscultation       Cardiovascular hypertension, +CHF  Normal cardiovascular exam Rhythm:Regular Rate:Normal  Echo 01/12/22:    1. The left ventricle is normal in size with normal wall thickness.    2. The left ventricular systolic function is normal, LVEF is visually  estimated at > 55%.    3. There is mild aortic regurgitation.    4. The right ventricle is normal in size, with normal systolic function.   ECG 12/03/21:  NORMAL SINUS RHYTHM  LOW VOLTAGE QRS     Neuro/Psych  Headaches PSYCHIATRIC DISORDERS Anxiety        GI/Hepatic ,GERD  ,,NAFLD   Endo/Other  Obesity   Renal/GU negative Renal ROS     Musculoskeletal  (+) Arthritis ,    Abdominal   Peds  Hematology negative hematology ROS (+)   Anesthesia Other Findings   Reproductive/Obstetrics                             Anesthesia Physical Anesthesia Plan  ASA: 2  Anesthesia Plan: General and Regional   Post-op Pain Management: Regional block*   Induction: Intravenous  PONV Risk Score and Plan: 3 and Ondansetron, Dexamethasone and Treatment may vary due to age or medical condition  Airway Management Planned: Oral ETT  Additional Equipment:   Intra-op Plan:   Post-operative Plan: Extubation in OR  Informed Consent: I have reviewed the patients History and Physical, chart, labs and discussed the procedure including the risks, benefits and alternatives for the proposed anesthesia with the patient or authorized  representative who has indicated his/her understanding and acceptance.     Dental advisory given  Plan Discussed with: CRNA  Anesthesia Plan Comments: (Plan for preoperative interscalene nerve block and GETA.  Patient consented for risks of anesthesia including but not limited to:  - adverse reactions to medications - damage to eyes, teeth, lips or other oral mucosa - nerve damage due to positioning  - sore throat or hoarseness - damage to heart, brain, nerves, lungs, other parts of body or loss of life  Informed patient about role of CRNA in peri- and intra-operative care.  Patient voiced understanding.)        Anesthesia Quick Evaluation

## 2022-02-11 NOTE — H&P (Signed)
History of Present Illness:  Lynn Tapia is a 67 y.o. female that presents to clinic today for her preoperative history and evaluation. Patient presents unaccompanied. The patient is scheduled to undergo a right shoulder arthroscopy on 02/11/21 by Dr. Roland Rack. The patient reports a long history of right shoulder pain.   The patient's symptoms have progressed to the point that they decrease her quality of life. The patient has previously undergone conservative treatment including NSAIDS and activity modification without adequate control of her symptoms.  Patient has received all necessary clearances for surgery.   Patient reports penicillin allergy, but clarifies that this was due to lack of efficacy when she was a child, no rash or anaphylaxis.  Past Medical History:  Anxiety  remote past  Arthritis  Gout  Heart murmur  History of adenomatous polyp of colon  History of constipation  Hypertension  Migraine  NAFLD (nonalcoholic fatty liver disease)  Vitamin D deficiency, unspecified   Past Surgical History:  CHOLECYSTECTOMY 06/10/2000  PR COLONOSCOPY W/BIOPSY SINGLE/MULTIPLE 03/13/2015  SPHINCTEROTOMY ANAL 2020  PR SIGMOIDOSCOPY,BIOPSY 09/01/2018  PR COLONOSCOPY W/BIOPSY SINGLE/MULTIPLE 01/20/2021  HYSTERECTOMY   Current Medications:  ascorbic acid, vitamin C, (VITAMIN C) 250 MG tablet Take 250 mg by mouth once daily  chlorthalidone 25 MG tablet Take 25 mg by mouth every morning  cholecalciferol (VITAMIN D3) 1000 unit tablet Take 1,000 Units by mouth once daily  colchicine (COLCRYS) 0.6 mg tablet as directed  cyanocobalamin (VITAMIN B12) 1000 MCG tablet Take 1,000 mcg by mouth once daily  docusate (COLACE) 100 MG capsule Take 100 mg by mouth once daily  fluticasone propionate (FLONASE) 50 mcg/actuation nasal spray Place 1 spray into both nostrils 2 (two) times daily 16 g 0  KLOR-CON M10 10 mEq ER tablet Take 20 mEq by mouth once daily  magnesium oxide (MAG-OX) 400 mg (241.3 mg  magnesium) tablet Take 400 mg by mouth at bedtime  polyethylene glycol (MIRALAX) packet Take 17 g by mouth once daily Mix in 4-8ounces of fluid prior to taking.  spironolactone (ALDACTONE) 25 MG tablet Take 1 tablet by mouth once daily  SUMAtriptan (IMITREX) 50 MG tablet Take 1 tablet by mouth at onset of headache, repeat as needed every 2 hours until relief. Do not take more than 4 tablets in 24 hours 5   Allergies:  Penicillins Other (DOES NOT WORK)  Sulfa (Sulfonamide Antibiotics) Rash  Amlodipine Other (Pt states caused constipation)  Lisinopril Other (Intolerance - Made legs cold)  Telmisartan Other (Caused nightmares)   Social History:   Socioeconomic History:  Marital status: Married  Tobacco Use  Smoking status: Never  Smokeless tobacco: Never  Vaping Use  Vaping Use: Never used  Substance and Sexual Activity  Alcohol use: Not Currently  Drug use: Never  Sexual activity: Defer   Family History:   Lung cancer Mother  Breast cancer Mother  Diabetes type II Mother  Colon cancer Father  Stroke Father  High blood pressure (Hypertension) Father   Review of Systems:  A 10+ ROS was performed, reviewed, and the pertinent orthopaedic findings are documented in the HPI.   Physical Examination:  BP 128/88  Ht 167.6 cm ('5\' 6"'$ )  Wt 95.4 kg (210 lb 6.4 oz)  BMI 33.96 kg/m   Patient is a well-developed, well-nourished female in no acute distress. Patient has normal mood and affect. Patient is alert and oriented to person, place, and time.   HEENT: Atraumatic, normocephalic. Pupils equal and reactive to light. Extraocular motion intact. Noninjected sclera.  Cardiovascular: Regular rate and rhythm, with no murmurs, rubs, or gallops. Radial pulse 2+  Respiratory: Lungs clear to auscultation bilaterally.   Right shoulder exam: SKIN: Normal SWELLING: None WARMTH: None LYMPH NODES: No adenopathy palpable CREPITUS: None TENDERNESS: Mildly tender over anterior shoulder ROM  (active):  Forward flexion: 155 degrees Abduction: 150 degrees Internal rotation: L1 ROM (passive):  Forward flexion: 160 degrees Abduction: 155 degrees  ER/IR at 90 abd: 90 degrees/65 degrees  She describes mild-moderate pain with forward flexion, abduction, internal rotation, and internal rotation at 90 degrees of abduction.  STRENGTH: Forward flexion: 4-4+/5 Abduction: 4-4+/5 External rotation: 4-4+/5 Internal rotation: 4+/5 Pain with RC testing: Mild-moderate pain with resisted forward flexion and abduction, and mild pain with resisted external rotation  STABILITY: Normal  SPECIAL TESTS: Luan Pulling' test: Mild-moderately positive Speed's test: Positive Capsulitis - pain w/ passive ER: No Crossed arm test: Mildly positive Crank: Not evaluated Anterior apprehension: Negative Posterior apprehension: Not evaluated   Sensation is intact over the median, radial, ulnar, and axillary nerve distributions. Patient able to make an OK sign, thumbs up, and criss-cross the 2nd and 3rd digits.   MRI OF THE RIGHT SHOULDER WITHOUT CONTRAST: 1. Mild supraspinatus tendinosis with small partial-thickness bursal surface tear anteriorly at the insertion. 2. Moderate subscapularis and mild intra-articular biceps tendinosis. 3. Moderate acromioclavicular osteoarthritis. 4. Mild subacromial/subdeltoid bursitis.  Impression:  1. Nontraumatic incomplete tear of right rotator cuff.  2. Rotator cuff tendinitis, right.  3. Biceps tendonitis on right.   Plan:   1.    H&P reviewed and patient re-examined. No changes.

## 2022-02-11 NOTE — Anesthesia Procedure Notes (Signed)
Procedure Name: Intubation Date/Time: 02/11/2022 2:16 PM  Performed by: Nitesh Pitstick, [place], CRNAPre-anesthesia Checklist: Patient identified, Patient being monitored, Timeout performed, Emergency Drugs available and Suction available Patient Re-evaluated:Patient Re-evaluated prior to induction Oxygen Delivery Method: Circle system utilized Preoxygenation: Pre-oxygenation with 100% oxygen Induction Type: IV induction Ventilation: Mask ventilation without difficulty Laryngoscope Size: 3 and McGraph Grade View: Grade I Tube type: Oral Tube size: 7.0 mm Number of attempts: 1 Airway Equipment and Method: Stylet and Video-laryngoscopy Placement Confirmation: ETT inserted through vocal cords under direct vision, positive ETCO2 and breath sounds checked- equal and bilateral Secured at: 21 cm Tube secured with: Tape Dental Injury: Teeth and Oropharynx as per pre-operative assessment

## 2022-02-11 NOTE — Anesthesia Postprocedure Evaluation (Signed)
Anesthesia Post Note  Patient: Lynn Tapia  Procedure(s) Performed: SHOULDER ARTHROSCOPY WITH DEBRIDEMENT, DECOMPRESSION, ROTATOR CUFF REPAIR AND BIPEPS TENODESIS. (Right: Shoulder)  Patient location during evaluation: PACU Anesthesia Type: Regional and General Level of consciousness: awake and alert Pain management: pain level controlled Vital Signs Assessment: post-procedure vital signs reviewed and stable Respiratory status: spontaneous breathing, nonlabored ventilation, respiratory function stable and patient connected to nasal cannula oxygen Cardiovascular status: blood pressure returned to baseline and stable Postop Assessment: no apparent nausea or vomiting Anesthetic complications: yes  Encounter Notable Events  Notable Event Outcome Phase Comment  Failed regional Unresolved Intraprocedure Failed preoperative interscalene nerve block.     Last Vitals:  Vitals:   02/11/22 1628 02/11/22 1630  BP:  139/66  Pulse: 87 82  Resp: 16 13  Temp:  (!) 36.1 C  SpO2: 95% 95%    Last Pain:  Vitals:   02/11/22 1615  TempSrc:   PainSc: 0-No pain                 Dimas Millin

## 2022-02-11 NOTE — Transfer of Care (Signed)
Immediate Anesthesia Transfer of Care Note  Patient: Lynn Tapia  Procedure(s) Performed: SHOULDER ARTHROSCOPY WITH DEBRIDEMENT, DECOMPRESSION, ROTATOR CUFF REPAIR AND BIPEPS TENODESIS. (Right: Shoulder)  Patient Location: PACU  Anesthesia Type:General and Regional  Level of Consciousness: drowsy  Airway & Oxygen Therapy: Patient Spontanous Breathing and Patient connected to nasal cannula oxygen  Post-op Assessment: Report given to RN and Post -op Vital signs reviewed and stable  Post vital signs: Reviewed and stable  Last Vitals:  Vitals Value Taken Time  BP 143/62 02/11/22 1550  Temp 97   Pulse 84 02/11/22 1554  Resp 14 02/11/22 1554  SpO2 98 % 02/11/22 1554  Vitals shown include unvalidated device data.  Last Pain:  Vitals:   02/11/22 1212  TempSrc: Temporal  PainSc: 0-No pain         Complications:  Encounter Notable Events  Notable Event Outcome Phase Comment  Failed regional Unresolved Intraprocedure Failed preoperative interscalene nerve block.

## 2022-02-12 ENCOUNTER — Encounter: Payer: Self-pay | Admitting: Surgery

## 2022-02-15 DIAGNOSIS — M75111 Incomplete rotator cuff tear or rupture of right shoulder, not specified as traumatic: Secondary | ICD-10-CM | POA: Diagnosis not present

## 2022-02-15 DIAGNOSIS — R293 Abnormal posture: Secondary | ICD-10-CM | POA: Diagnosis not present

## 2022-02-15 DIAGNOSIS — M25511 Pain in right shoulder: Secondary | ICD-10-CM | POA: Diagnosis not present

## 2022-02-15 DIAGNOSIS — I10 Essential (primary) hypertension: Secondary | ICD-10-CM | POA: Diagnosis not present

## 2022-02-16 DIAGNOSIS — I10 Essential (primary) hypertension: Secondary | ICD-10-CM | POA: Diagnosis not present

## 2022-02-23 DIAGNOSIS — R293 Abnormal posture: Secondary | ICD-10-CM | POA: Diagnosis not present

## 2022-02-23 DIAGNOSIS — M75111 Incomplete rotator cuff tear or rupture of right shoulder, not specified as traumatic: Secondary | ICD-10-CM | POA: Diagnosis not present

## 2022-02-23 DIAGNOSIS — M25511 Pain in right shoulder: Secondary | ICD-10-CM | POA: Diagnosis not present

## 2022-02-26 DIAGNOSIS — I10 Essential (primary) hypertension: Secondary | ICD-10-CM | POA: Diagnosis not present

## 2022-02-26 LAB — BASIC METABOLIC PANEL: Potassium: 3.2 mEq/L — AB (ref 3.5–5.1)

## 2022-03-02 DIAGNOSIS — I10 Essential (primary) hypertension: Secondary | ICD-10-CM | POA: Diagnosis not present

## 2022-03-02 DIAGNOSIS — M75111 Incomplete rotator cuff tear or rupture of right shoulder, not specified as traumatic: Secondary | ICD-10-CM | POA: Diagnosis not present

## 2022-03-02 DIAGNOSIS — M25511 Pain in right shoulder: Secondary | ICD-10-CM | POA: Diagnosis not present

## 2022-03-02 DIAGNOSIS — R293 Abnormal posture: Secondary | ICD-10-CM | POA: Diagnosis not present

## 2022-03-05 DIAGNOSIS — R293 Abnormal posture: Secondary | ICD-10-CM | POA: Diagnosis not present

## 2022-03-05 DIAGNOSIS — M25511 Pain in right shoulder: Secondary | ICD-10-CM | POA: Diagnosis not present

## 2022-03-05 DIAGNOSIS — M75111 Incomplete rotator cuff tear or rupture of right shoulder, not specified as traumatic: Secondary | ICD-10-CM | POA: Diagnosis not present

## 2022-03-09 DIAGNOSIS — M75111 Incomplete rotator cuff tear or rupture of right shoulder, not specified as traumatic: Secondary | ICD-10-CM | POA: Diagnosis not present

## 2022-03-09 DIAGNOSIS — M25511 Pain in right shoulder: Secondary | ICD-10-CM | POA: Diagnosis not present

## 2022-03-09 DIAGNOSIS — R293 Abnormal posture: Secondary | ICD-10-CM | POA: Diagnosis not present

## 2022-03-11 DIAGNOSIS — M25511 Pain in right shoulder: Secondary | ICD-10-CM | POA: Diagnosis not present

## 2022-03-11 DIAGNOSIS — R293 Abnormal posture: Secondary | ICD-10-CM | POA: Diagnosis not present

## 2022-03-11 DIAGNOSIS — M75111 Incomplete rotator cuff tear or rupture of right shoulder, not specified as traumatic: Secondary | ICD-10-CM | POA: Diagnosis not present

## 2022-03-12 ENCOUNTER — Ambulatory Visit (INDEPENDENT_AMBULATORY_CARE_PROVIDER_SITE_OTHER): Payer: PPO | Admitting: Internal Medicine

## 2022-03-12 ENCOUNTER — Encounter: Payer: Self-pay | Admitting: Internal Medicine

## 2022-03-12 VITALS — BP 124/76 | HR 72 | Ht 66.5 in | Wt 216.0 lb

## 2022-03-12 DIAGNOSIS — E876 Hypokalemia: Secondary | ICD-10-CM | POA: Diagnosis not present

## 2022-03-12 DIAGNOSIS — I119 Hypertensive heart disease without heart failure: Secondary | ICD-10-CM | POA: Diagnosis not present

## 2022-03-12 DIAGNOSIS — I10 Essential (primary) hypertension: Secondary | ICD-10-CM

## 2022-03-12 DIAGNOSIS — G43109 Migraine with aura, not intractable, without status migrainosus: Secondary | ICD-10-CM

## 2022-03-12 NOTE — Assessment & Plan Note (Signed)
Clinically stable exam with well controlled BP on losartan 25 mg and chlorthalidone 25 mg. Tolerating medications without side effects. Pt to continue current regimen and low sodium diet. Will recheck K+

## 2022-03-12 NOTE — Assessment & Plan Note (Signed)
Migraines much improved since retirement Last one 2 months ago Headaches respond well to Imitrex

## 2022-03-12 NOTE — Progress Notes (Signed)
Date:  03/12/2022   Name:  Lynn Tapia   DOB:  1955/05/07   MRN:  [id number]   Chief Complaint: New Patient (Initial Visit) and hypokalemia (Recheck Potassium. )  Hypertension This is a chronic problem. The problem is controlled. Pertinent negatives include no chest pain or shortness of breath. Past treatments include angiotensin blockers and diuretics. The current treatment provides significant improvement. There are no compliance problems (low potassium - supplemented).   Migraine  This is a recurrent problem. The problem occurs intermittently. The problem has been unchanged. The pain is located in the Left unilateral region. Associated symptoms include photophobia. Pertinent negatives include no coughing, fever, nausea or phonophobia. Her past medical history is significant for hypertension.  Hypokalemia - she has struggled with low K recently on chlorthalidone 50 mg.  Still high with reduced dose to 25 mg.  On K+ supplement.  BP not controlled so losartan 25 mg added. Lab Results  Component Value Date   NA 141 12/18/2021   K 3.2 (A) 02/26/2022   CO2 33 (A) 12/18/2021   BUN 16 12/18/2021   CREATININE 0.8 12/18/2021   CALCIUM 10.0 12/18/2021   EGFR 84 12/18/2021   No results found for: "CHOL", "HDL", "LDLCALC", "LDLDIRECT", "TRIG", "CHOLHDL" No results found for: "TSH" No results found for: "HGBA1C" No results found for: "WBC", "HGB", "HCT", "MCV", "PLT" No results found for: "ALT", "AST", "GGT", "ALKPHOS", "BILITOT" No results found for: "25OHVITD2", "25OHVITD3", "VD25OH"   Review of Systems  Constitutional:  Negative for chills, fatigue and fever.  HENT:  Negative for trouble swallowing.   Eyes:  Positive for photophobia.  Respiratory:  Negative for cough, chest tightness and shortness of breath.   Cardiovascular:  Negative for chest pain and leg swelling.  Gastrointestinal:  Positive for constipation. Negative for anal bleeding, blood in stool and nausea.   Musculoskeletal:  Positive for arthralgias (right shoulder pain).  Skin:  Negative for color change.  Psychiatric/Behavioral:  Negative for dysphoric mood and suicidal ideas. The patient is not nervous/anxious.     Patient Active Problem List   Diagnosis Date Noted   Superior labrum anterior-to-posterior (SLAP) tear of right shoulder 02/11/2022   Cervical spondylosis 05/22/2021   Family history of breast cancer in first degree relative 09/16/2020   Migraine with aura and without status migrainosus, not intractable 09/16/2020   Mixed hyperlipidemia 03/09/2018   Asymptomatic microscopic hematuria 04/29/2016   Chronic idiopathic constipation 04/20/2016   Internal hemorrhoids with complication 79/03/4095   Rectocele 09/17/2015   History of adenomatous polyp of colon 03/06/2015   Hypertensive heart disease without CHF 11/28/2014   Primary hypertension 11/26/2012    Allergies  Allergen Reactions   Penicillins     Reports this happened at 54 months old    Sulfa Antibiotics Rash   Telmisartan Other (See Comments)    Patient reports nightmares     Past Surgical History:  Procedure Laterality Date   ABDOMINAL HYSTERECTOMY  03/2008   ANUS SURGERY  12/2018   CHOLECYSTECTOMY  2002   at [place] SUBACROMIAL DECOMPRESSION, ROTATOR CUFF REPAIR AND BICEP TENDON REPAIR Right 02/11/2022   Procedure: SHOULDER ARTHROSCOPY WITH DEBRIDEMENT, DECOMPRESSION, ROTATOR CUFF REPAIR AND BIPEPS TENODESIS.;  Surgeon: Corky Mull, MD;  Location: [facility] ORS;  Service: Orthopedics;  Laterality: Right;    Social History   Tobacco Use   Smoking status: Never   Smokeless tobacco: Never  Vaping Use   Vaping Use: Never used  Substance  Use Topics   Alcohol use: Never   Drug use: Never     Medication list has been reviewed and updated.  Current Meds  Medication Sig   acyclovir ointment (ZOVIRAX) 5 % Apply 1 Application topically every 3 (three) hours.   ascorbic acid (VITAMIN C)  1000 MG tablet Take 1,000 mg by mouth daily.   chlorthalidone (HYGROTON) 25 MG tablet Take 25 mg by mouth daily.   cholecalciferol (VITAMIN D3) 25 MCG (1000 UNIT) tablet Take 1,000 Units by mouth daily.   colchicine 0.6 MG tablet Take 0.6 mg by mouth daily as needed.   cyanocobalamin (VITAMIN B12) 1000 MCG tablet Take 1,000 mcg by mouth daily.   docusate sodium (COLACE) 100 MG capsule Take 100 mg by mouth 2 (two) times daily.   fluocinonide cream (LIDEX) 4.19 % Apply 1 Application topically 2 (two) times daily.   fluticasone (FLONASE) 50 MCG/ACT nasal spray INSTILL 2 SPRAYS BY EACH NARE ROUTE DAILY.   losartan (COZAAR) 25 MG tablet Take 25 mg by mouth daily.   magnesium oxide (MAG-OX) 400 (240 Mg) MG tablet Take 400 mg by mouth daily.   polyethylene glycol (MIRALAX / GLYCOLAX) 17 g packet Take 17 g by mouth daily.   potassium chloride (KLOR-CON M10) 10 MEQ tablet Take 20 mEq by mouth 2 (two) times daily.   SUMAtriptan (IMITREX) 50 MG tablet Take 50 mg by mouth every 2 (two) hours as needed for migraine. May repeat in 2 hours if headache persists or recurs.       03/12/2022    2:23 PM  GAD 7 : Generalized Anxiety Score  Nervous, Anxious, on Edge 0  Control/stop worrying 0  Worry too much - different things 0  Trouble relaxing 0  Restless 0  Easily annoyed or irritable 0  Afraid - awful might happen 0  Total GAD 7 Score 0  Anxiety Difficulty Not difficult at all       03/12/2022    2:23 PM  Depression screen PHQ 2/9  Decreased Interest 0  Down, Depressed, Hopeless 0  PHQ - 2 Score 0  Altered sleeping 0  Tired, decreased energy 0  Change in appetite 0  Feeling bad or failure about yourself  0  Trouble concentrating 0  Moving slowly or fidgety/restless 0  Suicidal thoughts 0  PHQ-9 Score 0  Difficult doing work/chores Not difficult at all    BP Readings from Last 3 Encounters:  03/12/22 124/76  02/11/22 139/66    Physical Exam Vitals and nursing note reviewed.   Constitutional:      General: She is not in acute distress.    Appearance: Normal appearance. She is well-developed.  HENT:     Head: Normocephalic and atraumatic.  Neck:     Vascular: No carotid bruit.  Cardiovascular:     Rate and Rhythm: Normal rate and regular rhythm.  Pulmonary:     Effort: Pulmonary effort is normal. No respiratory distress.     Breath sounds: No wheezing or rhonchi.  Musculoskeletal:     Cervical back: Normal range of motion.     Right lower leg: No edema.     Left lower leg: No edema.     Comments: Right shoulder surgical site healed with minimal scar  Lymphadenopathy:     Cervical: No cervical adenopathy.  Skin:    General: Skin is warm and dry.     Capillary Refill: Capillary refill takes less than 2 seconds.     Findings: No rash.  Neurological:     General: No focal deficit present.     Mental Status: She is alert and oriented to person, place, and time.  Psychiatric:        Mood and Affect: Mood normal.        Behavior: Behavior normal.     Wt Readings from Last 3 Encounters:  03/12/22 216 lb (98 kg)  02/11/22 210 lb (95.3 kg)  02/03/22 210 lb (95.3 kg)    BP 124/76   Pulse 72   Ht 5' 6.5" (1.689 m)   Wt 216 lb (98 kg)   SpO2 96%   BMI 34.34 kg/m   Assessment and Plan: Problem List Items Addressed This Visit       Cardiovascular and Mediastinum   Migraine with aura and without status migrainosus, not intractable    Migraines much improved since retirement Last one 2 months ago Headaches respond well to Imitrex      Relevant Medications   losartan (COZAAR) 25 MG tablet   Hypertensive heart disease without CHF - Primary (Chronic)   Relevant Medications   losartan (COZAAR) 25 MG tablet   Primary hypertension (Chronic)    Clinically stable exam with well controlled BP on losartan 25 mg and chlorthalidone 25 mg. Tolerating medications without side effects. Pt to continue current regimen and low sodium diet. Will recheck  K+       Relevant Medications   losartan (COZAAR) 25 MG tablet   Other Visit Diagnoses     Hypokalemia       Relevant Orders   Basic metabolic panel        Partially dictated using Dragon software. Any errors are unintentional.  Halina Maidens, MD [facility]  03/12/2022

## 2022-03-13 LAB — BASIC METABOLIC PANEL
BUN/Creatinine Ratio: 17 (ref 12–28)
BUN: 16 mg/dL (ref 8–27)
CO2: 29 mmol/L (ref 20–29)
Calcium: 9.8 mg/dL (ref 8.7–10.3)
Chloride: 97 mmol/L (ref 96–106)
Creatinine, Ser: 0.94 mg/dL (ref 0.57–1.00)
Glucose: 87 mg/dL (ref 70–99)
Potassium: 3.8 mmol/L (ref 3.5–5.2)
Sodium: 142 mmol/L (ref 134–144)
eGFR: 67 mL/min/{1.73_m2} (ref >59)

## 2022-03-16 DIAGNOSIS — R293 Abnormal posture: Secondary | ICD-10-CM | POA: Diagnosis not present

## 2022-03-16 DIAGNOSIS — M75111 Incomplete rotator cuff tear or rupture of right shoulder, not specified as traumatic: Secondary | ICD-10-CM | POA: Diagnosis not present

## 2022-03-16 DIAGNOSIS — M25511 Pain in right shoulder: Secondary | ICD-10-CM | POA: Diagnosis not present

## 2022-03-19 DIAGNOSIS — M25511 Pain in right shoulder: Secondary | ICD-10-CM | POA: Diagnosis not present

## 2022-03-19 DIAGNOSIS — R293 Abnormal posture: Secondary | ICD-10-CM | POA: Diagnosis not present

## 2022-03-19 DIAGNOSIS — M75111 Incomplete rotator cuff tear or rupture of right shoulder, not specified as traumatic: Secondary | ICD-10-CM | POA: Diagnosis not present

## 2022-03-23 DIAGNOSIS — M75111 Incomplete rotator cuff tear or rupture of right shoulder, not specified as traumatic: Secondary | ICD-10-CM | POA: Diagnosis not present

## 2022-03-23 DIAGNOSIS — M25511 Pain in right shoulder: Secondary | ICD-10-CM | POA: Diagnosis not present

## 2022-03-23 DIAGNOSIS — R293 Abnormal posture: Secondary | ICD-10-CM | POA: Diagnosis not present

## 2022-03-26 DIAGNOSIS — R293 Abnormal posture: Secondary | ICD-10-CM | POA: Diagnosis not present

## 2022-03-26 DIAGNOSIS — M25511 Pain in right shoulder: Secondary | ICD-10-CM | POA: Diagnosis not present

## 2022-03-26 DIAGNOSIS — M75111 Incomplete rotator cuff tear or rupture of right shoulder, not specified as traumatic: Secondary | ICD-10-CM | POA: Diagnosis not present

## 2022-03-29 DIAGNOSIS — M75111 Incomplete rotator cuff tear or rupture of right shoulder, not specified as traumatic: Secondary | ICD-10-CM | POA: Diagnosis not present

## 2022-03-29 DIAGNOSIS — R293 Abnormal posture: Secondary | ICD-10-CM | POA: Diagnosis not present

## 2022-03-29 DIAGNOSIS — M25511 Pain in right shoulder: Secondary | ICD-10-CM | POA: Diagnosis not present

## 2022-03-31 DIAGNOSIS — R293 Abnormal posture: Secondary | ICD-10-CM | POA: Diagnosis not present

## 2022-03-31 DIAGNOSIS — M25511 Pain in right shoulder: Secondary | ICD-10-CM | POA: Diagnosis not present

## 2022-03-31 DIAGNOSIS — M75111 Incomplete rotator cuff tear or rupture of right shoulder, not specified as traumatic: Secondary | ICD-10-CM | POA: Diagnosis not present

## 2022-04-06 DIAGNOSIS — R293 Abnormal posture: Secondary | ICD-10-CM | POA: Diagnosis not present

## 2022-04-06 DIAGNOSIS — M75111 Incomplete rotator cuff tear or rupture of right shoulder, not specified as traumatic: Secondary | ICD-10-CM | POA: Diagnosis not present

## 2022-04-06 DIAGNOSIS — M25511 Pain in right shoulder: Secondary | ICD-10-CM | POA: Diagnosis not present

## 2022-04-09 DIAGNOSIS — M25511 Pain in right shoulder: Secondary | ICD-10-CM | POA: Diagnosis not present

## 2022-04-09 DIAGNOSIS — M75111 Incomplete rotator cuff tear or rupture of right shoulder, not specified as traumatic: Secondary | ICD-10-CM | POA: Diagnosis not present

## 2022-04-09 DIAGNOSIS — R293 Abnormal posture: Secondary | ICD-10-CM | POA: Diagnosis not present

## 2022-04-13 DIAGNOSIS — R293 Abnormal posture: Secondary | ICD-10-CM | POA: Diagnosis not present

## 2022-04-13 DIAGNOSIS — M75111 Incomplete rotator cuff tear or rupture of right shoulder, not specified as traumatic: Secondary | ICD-10-CM | POA: Diagnosis not present

## 2022-04-13 DIAGNOSIS — M25511 Pain in right shoulder: Secondary | ICD-10-CM | POA: Diagnosis not present

## 2022-04-16 DIAGNOSIS — M75111 Incomplete rotator cuff tear or rupture of right shoulder, not specified as traumatic: Secondary | ICD-10-CM | POA: Diagnosis not present

## 2022-04-16 DIAGNOSIS — R293 Abnormal posture: Secondary | ICD-10-CM | POA: Diagnosis not present

## 2022-04-16 DIAGNOSIS — M25511 Pain in right shoulder: Secondary | ICD-10-CM | POA: Diagnosis not present

## 2022-04-20 DIAGNOSIS — M75111 Incomplete rotator cuff tear or rupture of right shoulder, not specified as traumatic: Secondary | ICD-10-CM | POA: Diagnosis not present

## 2022-04-20 DIAGNOSIS — R293 Abnormal posture: Secondary | ICD-10-CM | POA: Diagnosis not present

## 2022-04-20 DIAGNOSIS — M25511 Pain in right shoulder: Secondary | ICD-10-CM | POA: Diagnosis not present

## 2022-04-23 DIAGNOSIS — M25511 Pain in right shoulder: Secondary | ICD-10-CM | POA: Diagnosis not present

## 2022-04-23 DIAGNOSIS — M75111 Incomplete rotator cuff tear or rupture of right shoulder, not specified as traumatic: Secondary | ICD-10-CM | POA: Diagnosis not present

## 2022-04-23 DIAGNOSIS — R293 Abnormal posture: Secondary | ICD-10-CM | POA: Diagnosis not present

## 2022-04-27 DIAGNOSIS — M75111 Incomplete rotator cuff tear or rupture of right shoulder, not specified as traumatic: Secondary | ICD-10-CM | POA: Diagnosis not present

## 2022-04-27 DIAGNOSIS — R293 Abnormal posture: Secondary | ICD-10-CM | POA: Diagnosis not present

## 2022-04-27 DIAGNOSIS — M25511 Pain in right shoulder: Secondary | ICD-10-CM | POA: Diagnosis not present

## 2022-04-29 ENCOUNTER — Telehealth: Payer: Self-pay | Admitting: Internal Medicine

## 2022-04-29 NOTE — Telephone Encounter (Signed)
Contacted Lynn Tapia to schedule their annual wellness visit. Welcome to Medicare visit Due by 01/2023.  Sherol Dade; Care Guide [facility] Direct Dial: [phone]

## 2022-04-30 DIAGNOSIS — M75111 Incomplete rotator cuff tear or rupture of right shoulder, not specified as traumatic: Secondary | ICD-10-CM | POA: Diagnosis not present

## 2022-04-30 DIAGNOSIS — R293 Abnormal posture: Secondary | ICD-10-CM | POA: Diagnosis not present

## 2022-04-30 DIAGNOSIS — M25511 Pain in right shoulder: Secondary | ICD-10-CM | POA: Diagnosis not present

## 2022-05-04 DIAGNOSIS — M25511 Pain in right shoulder: Secondary | ICD-10-CM | POA: Diagnosis not present

## 2022-05-04 DIAGNOSIS — M75111 Incomplete rotator cuff tear or rupture of right shoulder, not specified as traumatic: Secondary | ICD-10-CM | POA: Diagnosis not present

## 2022-05-04 DIAGNOSIS — R293 Abnormal posture: Secondary | ICD-10-CM | POA: Diagnosis not present

## 2022-05-06 DIAGNOSIS — M75111 Incomplete rotator cuff tear or rupture of right shoulder, not specified as traumatic: Secondary | ICD-10-CM | POA: Diagnosis not present

## 2022-05-06 DIAGNOSIS — R293 Abnormal posture: Secondary | ICD-10-CM | POA: Diagnosis not present

## 2022-05-06 DIAGNOSIS — M25511 Pain in right shoulder: Secondary | ICD-10-CM | POA: Diagnosis not present

## 2022-05-11 DIAGNOSIS — R293 Abnormal posture: Secondary | ICD-10-CM | POA: Diagnosis not present

## 2022-05-11 DIAGNOSIS — M75111 Incomplete rotator cuff tear or rupture of right shoulder, not specified as traumatic: Secondary | ICD-10-CM | POA: Diagnosis not present

## 2022-05-11 DIAGNOSIS — M25511 Pain in right shoulder: Secondary | ICD-10-CM | POA: Diagnosis not present

## 2022-05-13 ENCOUNTER — Ambulatory Visit (INDEPENDENT_AMBULATORY_CARE_PROVIDER_SITE_OTHER): Payer: PPO | Admitting: Family Medicine

## 2022-05-13 ENCOUNTER — Ambulatory Visit: Payer: Self-pay

## 2022-05-13 DIAGNOSIS — R293 Abnormal posture: Secondary | ICD-10-CM | POA: Diagnosis not present

## 2022-05-13 DIAGNOSIS — K2900 Acute gastritis without bleeding: Secondary | ICD-10-CM | POA: Diagnosis not present

## 2022-05-13 DIAGNOSIS — M25511 Pain in right shoulder: Secondary | ICD-10-CM | POA: Diagnosis not present

## 2022-05-13 DIAGNOSIS — M109 Gout, unspecified: Secondary | ICD-10-CM | POA: Diagnosis not present

## 2022-05-13 DIAGNOSIS — M75111 Incomplete rotator cuff tear or rupture of right shoulder, not specified as traumatic: Secondary | ICD-10-CM | POA: Diagnosis not present

## 2022-05-13 DIAGNOSIS — K2970 Gastritis, unspecified, without bleeding: Secondary | ICD-10-CM | POA: Insufficient documentation

## 2022-05-13 MED ORDER — PANTOPRAZOLE SODIUM 40 MG PO TBEC: 40.0000 mg | DELAYED_RELEASE_TABLET | Freq: Every day | ORAL | 0 refills | Status: DC

## 2022-05-13 MED ORDER — COLCHICINE 0.6 MG PO TABS: ORAL_TABLET | ORAL | 0 refills | Status: DC

## 2022-05-13 NOTE — Telephone Encounter (Signed)
  Chief Complaint: Gout flare, Stomach pain Symptoms: above Frequency: Gout started yesterday, stomach has been going on for awhile Pertinent Negatives: Patient denies  Disposition: [] ED /[] Urgent Care (no appt availability in office) / [x] Appointment(In office/virtual)/ []  [facility]/ [] Home Care/ [] Refused Recommended Disposition /[] [facility]/ []  Follow-up with PCP Additional Notes: Pt called for a gout flare in left big toe. PT has a hx of gout. PT wanted an Rx for colchicine called in. Pt is taking a lot of IBU for shoulder pain following her shoulder sx. She states she is taking them 4 at a time.   PT also has ongoing stomach pain that has been with her for awhile. Pt is unsure if pain is d/t IBU usage or something else. Pt has a hx of h- pylori.   Pt has been taking pepto bismol for stomach pain.    Summary: Gout Advice   Pt is calling to report a flare up with Gout yesterday. Pt started taking cherry juice yesterday. Previous provider would write a script for colchicine 0.6 MG tablet [id number]. Pt has an appt next week. And there is an appt tomorrow. However the patient is not able to come into the office tomorrow. Pt is unable to put the sheet over her foot due to the pain. Please advise        Reason for Disposition  Pain in the big toe joint  Answer Assessment - Initial Assessment Questions 1. ONSET: "When did the pain start?"      Yesterday 2. LOCATION: "Where is the pain located?"      Left big toe 3. PAIN: "How bad is the pain?"    (Scale 1-10; or mild, moderate, severe)  - MILD (1-3): doesn't interfere with normal activities.   - MODERATE (4-7): interferes with normal activities (e.g., work or school) or awakens from sleep, limping.   - SEVERE (8-10): excruciating pain, unable to do any normal activities, unable to walk.      unsure 4. WORK OR EXERCISE: "Has there been any recent work or exercise that involved this part of the body?"       5.  CAUSE: "What do you think is causing the foot pain?"     Gout 6. OTHER SYMPTOMS: "Do you have any other symptoms?" (e.g., leg pain, rash, fever, numbness)     Swollen and red  Protocols used: Foot Pain-A-AH

## 2022-05-13 NOTE — Assessment & Plan Note (Signed)
Noted worsening in the setting of more consistent NSAID dosing due to comorbid right shoulder pain following surgery and physical therapy.  Does have a history of H. pylori status post treatment.

## 2022-05-13 NOTE — Progress Notes (Signed)
     Primary Care / Sports Medicine Office Visit  Patient Information:  Patient ID: Lynn Tapia, female DOB: [birth year] Age: 67 y.o. MRN: [id number]   Lynn Tapia is a pleasant 67 y.o. female presenting with the following:  Chief Complaint  Patient presents with   Gout    Left big toe, HX of gout, 1 day    There were no vitals filed for this visit. There were no vitals filed for this visit. There is no height or weight on file to calculate BMI.  No results found.   Independent interpretation of notes and tests performed by another provider:   None  Procedures performed:   None  Pertinent History, Exam, Impression, and Recommendations:   [name] was seen today for gout.  Gouty arthritis of left great toe Assessment & Plan: Acute flare of chronic gout localizing to 1st great toe MTP.   - Colchicine course - GI prophylaxis given comorbid GERD - Return as-needed  Orders: -     Colchicine; Take 3 tablets (1.8 mg total) by mouth daily for 1 day, THEN 1 tablet (0.6 mg total) daily for 10 days. Stop medication 48 hours after flare resolves. (Patient not taking: Reported on 05/21/2022)  Dispense: 13 tablet; Refill: 0  Other acute gastritis without hemorrhage Assessment & Plan: Noted worsening in the setting of more consistent NSAID dosing due to comorbid right shoulder pain following surgery and physical therapy.  Does have a history of H. pylori status post treatment.    Orders: -     Pantoprazole Sodium; Take 1 tablet (40 mg total) by mouth daily.  Dispense: 30 tablet; Refill: 0     Orders & Medications Meds ordered this encounter  Medications   colchicine 0.6 MG tablet    Sig: Take 3 tablets (1.8 mg total) by mouth daily for 1 day, THEN 1 tablet (0.6 mg total) daily for 10 days. Stop medication 48 hours after flare resolves.    Dispense:  13 tablet    Refill:  0   pantoprazole (PROTONIX) 40 MG tablet    Sig: Take 1 tablet (40 mg total) by mouth daily.     Dispense:  30 tablet    Refill:  0   No orders of the defined types were placed in this encounter.    No follow-ups on file.     Jerrol Banana, MD, [facility]   Primary Care Sports Medicine Primary Care and Sports Medicine at [facility]

## 2022-05-14 ENCOUNTER — Other Ambulatory Visit: Payer: Self-pay | Admitting: Internal Medicine

## 2022-05-14 DIAGNOSIS — S43431A Superior glenoid labrum lesion of right shoulder, initial encounter: Secondary | ICD-10-CM | POA: Diagnosis not present

## 2022-05-14 DIAGNOSIS — M75111 Incomplete rotator cuff tear or rupture of right shoulder, not specified as traumatic: Secondary | ICD-10-CM | POA: Diagnosis not present

## 2022-05-14 DIAGNOSIS — M7581 Other shoulder lesions, right shoulder: Secondary | ICD-10-CM | POA: Diagnosis not present

## 2022-05-14 DIAGNOSIS — M7521 Bicipital tendinitis, right shoulder: Secondary | ICD-10-CM | POA: Diagnosis not present

## 2022-05-14 DIAGNOSIS — Z1231 Encounter for screening mammogram for malignant neoplasm of breast: Secondary | ICD-10-CM

## 2022-05-18 DIAGNOSIS — R293 Abnormal posture: Secondary | ICD-10-CM | POA: Diagnosis not present

## 2022-05-18 DIAGNOSIS — M75111 Incomplete rotator cuff tear or rupture of right shoulder, not specified as traumatic: Secondary | ICD-10-CM | POA: Diagnosis not present

## 2022-05-18 DIAGNOSIS — M25511 Pain in right shoulder: Secondary | ICD-10-CM | POA: Diagnosis not present

## 2022-05-20 DIAGNOSIS — M75111 Incomplete rotator cuff tear or rupture of right shoulder, not specified as traumatic: Secondary | ICD-10-CM | POA: Diagnosis not present

## 2022-05-20 DIAGNOSIS — M25511 Pain in right shoulder: Secondary | ICD-10-CM | POA: Diagnosis not present

## 2022-05-20 DIAGNOSIS — R293 Abnormal posture: Secondary | ICD-10-CM | POA: Diagnosis not present

## 2022-05-21 ENCOUNTER — Ambulatory Visit (INDEPENDENT_AMBULATORY_CARE_PROVIDER_SITE_OTHER): Payer: PPO | Admitting: Internal Medicine

## 2022-05-21 ENCOUNTER — Ambulatory Visit (INDEPENDENT_AMBULATORY_CARE_PROVIDER_SITE_OTHER): Payer: PPO

## 2022-05-21 ENCOUNTER — Encounter: Payer: Self-pay | Admitting: Internal Medicine

## 2022-05-21 VITALS — BP 128/76 | Resp 67 | Ht 66.0 in | Wt 222.0 lb

## 2022-05-21 VITALS — BP 128/76 | HR 67 | Ht 66.5 in | Wt 222.0 lb

## 2022-05-21 DIAGNOSIS — I10 Essential (primary) hypertension: Secondary | ICD-10-CM | POA: Diagnosis not present

## 2022-05-21 DIAGNOSIS — I119 Hypertensive heart disease without heart failure: Secondary | ICD-10-CM

## 2022-05-21 DIAGNOSIS — Z0000 Encounter for general adult medical examination without abnormal findings: Secondary | ICD-10-CM

## 2022-05-21 DIAGNOSIS — M109 Gout, unspecified: Secondary | ICD-10-CM | POA: Diagnosis not present

## 2022-05-21 DIAGNOSIS — K219 Gastro-esophageal reflux disease without esophagitis: Secondary | ICD-10-CM

## 2022-05-21 DIAGNOSIS — Z23 Encounter for immunization: Secondary | ICD-10-CM

## 2022-05-21 DIAGNOSIS — Z6835 Body mass index (BMI) 35.0-35.9, adult: Secondary | ICD-10-CM | POA: Diagnosis not present

## 2022-05-21 NOTE — Assessment & Plan Note (Deleted)
Clinically stable exam with well controlled BP on losartan and hctz. Tolerating medications without side effects. Pt to continue current regimen and low sodium diet.  

## 2022-05-21 NOTE — Progress Notes (Signed)
Subjective:   Lynn Tapia is a 67 y.o. female who presents for Medicare Annual (Subsequent) preventive examination.  Review of Systems    Pt was in the office today.       Objective:    Today's Vitals   05/21/22 1046  BP: 128/76  Resp: (!) 67  Weight: 222 lb (100.7 kg)  Height: 5\' 6"  (1.676 m)   Body mass index is 35.83 kg/m.     02/11/2022   12:13 PM 02/03/2022    9:04 AM  Advanced Directives  Does Patient Have a Medical Advance Directive? No No    Current Medications (verified) Outpatient Encounter Medications as of 05/21/2022  Medication Sig   acyclovir ointment (ZOVIRAX) 5 % Apply 1 Application topically every 3 (three) hours.   ascorbic acid (VITAMIN C) 1000 MG tablet Take 1,000 mg by mouth daily.   chlorthalidone (HYGROTON) 25 MG tablet Take 25 mg by mouth daily.   cholecalciferol (VITAMIN D3) 25 MCG (1000 UNIT) tablet Take 1,000 Units by mouth daily.   colchicine 0.6 MG tablet Take 3 tablets (1.8 mg total) by mouth daily for 1 day, THEN 1 tablet (0.6 mg total) daily for 10 days. Stop medication 48 hours after flare resolves. (Patient not taking: Reported on 05/21/2022)   cyanocobalamin (VITAMIN B12) 1000 MCG tablet Take 1,000 mcg by mouth daily.   docusate sodium (COLACE) 100 MG capsule Take 100 mg by mouth 2 (two) times daily.   fluocinonide cream (LIDEX) 0.05 % Apply 1 Application topically 2 (two) times daily.   fluticasone (FLONASE) 50 MCG/ACT nasal spray INSTILL 2 SPRAYS BY EACH NARE ROUTE DAILY.   losartan (COZAAR) 25 MG tablet Take 25 mg by mouth daily.   magnesium oxide (MAG-OX) 400 (240 Mg) MG tablet Take 400 mg by mouth daily.   pantoprazole (PROTONIX) 40 MG tablet Take 1 tablet (40 mg total) by mouth daily.   polyethylene glycol (MIRALAX / GLYCOLAX) 17 g packet Take 17 g by mouth daily.   potassium chloride (KLOR-CON M10) 10 MEQ tablet Take 20 mEq by mouth 2 (two) times daily.   SUMAtriptan (IMITREX) 50 MG tablet Take 50 mg by mouth every 2 (two)  hours as needed for migraine. May repeat in 2 hours if headache persists or recurs.   No facility-administered encounter medications on file as of 05/21/2022.    Allergies (verified) Penicillins, Sulfa antibiotics, and Telmisartan   History: Past Medical History:  Diagnosis Date   Adenomatous polyp of colon    Ankle swelling    Arthritis    CHF (congestive heart failure)    Gout    Heart murmur    Hypertension    Hypokalemia    Migraine    Non-alcoholic fatty liver disease    Vitamin D deficiency, unspecified    Past Surgical History:  Procedure Laterality Date   ABDOMINAL HYSTERECTOMY  03/2008   ANUS SURGERY  12/2018   CHOLECYSTECTOMY  2002   at [facility] ARTHROSCOPY WITH SUBACROMIAL DECOMPRESSION, ROTATOR CUFF REPAIR AND BICEP TENDON REPAIR Right 02/11/2022   Procedure: SHOULDER ARTHROSCOPY WITH DEBRIDEMENT, DECOMPRESSION, ROTATOR CUFF REPAIR AND BIPEPS TENODESIS.;  Surgeon: Christena Flake, MD;  Location: [facility] ORS;  Service: Orthopedics;  Laterality: Right;   Family History  Problem Relation Age of Onset   CAD Mother 15       MI during her breast cancer   Diabetes Mother    Cancer Mother 67       Breast, mastectomy, ultimately  metastatic and caused her death   Cancer Father 76       Colon   Hypertension Father    Allergies Father    COPD Sister 50       Smoker   Asthma Sister    Diabetes Sister    Cancer Brother 25       Lung   Stroke Maternal Grandmother        Cerebral hemorrhage   CAD Maternal Grandfather 64   Emphysema Paternal Grandmother    Social History   Socioeconomic History   Marital status: Married    Spouse name: Not on file   Number of children: Not on file   Years of education: Not on file   Highest education level: GED or equivalent  Occupational History   Not on file  Tobacco Use   Smoking status: Never   Smokeless tobacco: Never  Vaping Use   Vaping Use: Never used  Substance and Sexual Activity   Alcohol use: Never   Drug  use: Never   Sexual activity: Yes    Birth control/protection: None  Other Topics Concern   Not on file  Social History Narrative   Not on file   Social Determinants of Health   Financial Resource Strain: Low Risk  (05/21/2022)   Overall Financial Resource Strain (CARDIA)    Difficulty of Paying Living Expenses: Not hard at all  Food Insecurity: No Food Insecurity (05/21/2022)   Hunger Vital Sign    Worried About Running Out of Food in the Last Year: Never true    Ran Out of Food in the Last Year: Never true  Transportation Needs: No Transportation Needs (05/21/2022)   PRAPARE - Administrator, Civil Service (Medical): No    Lack of Transportation (Non-Medical): No  Physical Activity: Unknown (05/13/2022)   Exercise Vital Sign    Days of Exercise per Week: 0 days    Minutes of Exercise per Session: Not on file  Stress: No Stress Concern Present (05/13/2022)   [employer] of Occupational Health - Occupational Stress Questionnaire    Feeling of Stress : Not at all  Social Connections: Socially Integrated (05/13/2022)   Social Connection and Isolation Panel [NHANES]    Frequency of Communication with Friends and Family: More than three times a week    Frequency of Social Gatherings with Friends and Family: More than three times a week    Attends Religious Services: More than 4 times per year    Active Member of [employer] or Organizations: Yes    Attends Banker Meetings: Not on file    Marital Status: Married    Tobacco Counseling Counseling given: Not Answered   Clinical Intake:                 Diabetic?NO         Activities of Daily Living    02/03/2022    9:05 AM 02/03/2022    9:03 AM  In your present state of health, do you have any difficulty performing the following activities:  Hearing?  0  Vision?  0  Comment  wears glasses  Difficulty concentrating or making decisions?  0  Walking or climbing stairs?  0  Dressing or  bathing?  0  Doing errands, shopping? 0     Patient Care Team: Reubin Milan, MD as PCP - General (Internal Medicine) Mackie Pai, MD as Referring Physician (Internal Medicine)  Indicate any recent Medical Services you may  have received from other than [facility] providers in the past year (date may be approximate).     Assessment:   This is a routine wellness examination for Lynn Tapia.  Hearing/Vision screen No results found.  Dietary issues and exercise activities discussed:     Goals Addressed   None   Depression Screen    05/21/2022   10:44 AM 03/12/2022    2:23 PM  PHQ 2/9 Scores  PHQ - 2 Score 0 0  PHQ- 9 Score 0 0    Fall Risk    05/21/2022   10:44 AM 03/12/2022    2:23 PM  Fall Risk   Falls in the past year? 0 0  Number falls in past yr: 0 0  Injury with Fall? 0 0  Risk for fall due to : No Fall Risks No Fall Risks  Follow up Falls evaluation completed Falls evaluation completed    FALL RISK PREVENTION PERTAINING TO THE HOME:  Any stairs in or around the home? No  If so, are there any without handrails? No  Home free of loose throw rugs in walkways, pet beds, electrical cords, etc? Yes  Adequate lighting in your home to reduce risk of falls? Yes   ASSISTIVE DEVICES UTILIZED TO PREVENT FALLS:  Life alert? No  Use of a cane, walker or w/c? No  Grab bars in the bathroom? No  Shower chair or bench in shower? No  Elevated toilet seat or a handicapped toilet? No   TIMED UP AND GO:  Was the test performed? No .  Length of time to ambulate 10 feet: n/a sec.   Gait steady and fast without use of assistive device  Cognitive Function:        Immunizations Immunization History  Administered Date(s) Administered   COVID-19, mRNA, vaccine(Comirnaty)12 years and older 11/26/2021   Influenza Inj Mdck Quad Pf 11/03/2017, 11/11/2020, 10/19/2021   Influenza Nasal 10/26/2012   Influenza,inj,quad, With Preservative 11/21/2014   Influenza-Unspecified  10/25/2015, 11/02/2016, 11/03/2017, 11/04/2018   PFIZER Comirnaty(Gray Top)Covid-19 Tri-Sucrose Vaccine 02/22/2019, 03/15/2019, 06/02/2020   PFIZER(Purple Top)SARS-COV-2 Vaccination 02/22/2019, 03/15/2019, 11/06/2019   Pneumococcal Polysaccharide-23 02/09/2008   Respiratory Syncytial Virus Vaccine,Recomb Aduvanted(Arexvy) 12/18/2021   Tdap 12/07/2012, 11/26/2021   Zoster Recombinat (Shingrix) 08/24/2016, 01/20/2017    TDAP status: Up to date  Flu Vaccine status: Up to date  Pneumococcal vaccine status: Due, Education has been provided regarding the importance of this vaccine. Advised may receive this vaccine at local pharmacy or Health Dept. Aware to provide a copy of the vaccination record if obtained from local pharmacy or Health Dept. Verbalized acceptance and understanding.  Covid-19 vaccine status: Completed vaccines  Qualifies for Shingles Vaccine? Yes   Zostavax completed No   Shingrix Completed?: Yes  Screening Tests Health Maintenance  Topic Date Due   HIV Screening  Never done   Hepatitis C Screening  Never done   Pneumonia Vaccine 41+ Years old (2 of 2 - PCV) 02/04/2022   DEXA SCAN  Never done   INFLUENZA VACCINE  09/09/2022   Medicare Annual Wellness (AWV)  05/21/2023   MAMMOGRAM  10/23/2023   COLONOSCOPY (Pts 45-21yrs Insurance coverage will need to be confirmed)  01/21/2031   DTaP/Tdap/Td (3 - Td or Tdap) 11/27/2031   COVID-19 Vaccine  Completed   Zoster Vaccines- Shingrix  Completed   HPV VACCINES  Aged Out    Health Maintenance  Health Maintenance Due  Topic Date Due   HIV Screening  Never done   Hepatitis C  Screening  Never done   Pneumonia Vaccine 35+ Years old (2 of 2 - PCV) 02/04/2022   DEXA SCAN  Never done    Colorectal cancer screening: Type of screening: Colonoscopy. Completed 01/20/2021. Repeat every 10 years  Mammogram status: Completed 10/22/2021. Repeat every year    Lung Cancer Screening: (Low Dose CT Chest recommended if Age 68-80  years, 30 pack-year currently smoking OR have quit w/in 15years.) does not qualify.   Lung Cancer Screening Referral: no  Additional Screening:  Hepatitis C Screening: does qualify; Completed n/a  Vision Screening: Recommended annual ophthalmology exams for early detection of glaucoma and other disorders of the eye. Is the patient up to date with their annual eye exam?  Yes  Who is the provider or what is the name of the office in which the patient attends annual eye exams? [facility] If pt is not established with a provider, would they like to be referred to a provider to establish care? No .   Dental Screening: Recommended annual dental exams for proper oral hygiene  Community Resource Referral / Chronic Care Management: CRR required this visit?  No   CCM required this visit?  No      Plan:     I have personally reviewed and noted the following in the patient's chart:   Medical and social history Use of alcohol, tobacco or illicit drugs  Current medications and supplements including opioid prescriptions. Patient is not currently taking opioid prescriptions. Functional ability and status Nutritional status Physical activity Advanced directives List of other physicians Hospitalizations, surgeries, and ER visits in previous 12 months Vitals Screenings to include cognitive, depression, and falls Referrals and appointments  In addition, I have reviewed and discussed with patient certain preventive protocols, quality metrics, and best practice recommendations. A written personalized care plan for preventive services as well as general preventive health recommendations were provided to patient.     Eulis Canner Lynn Tapia, CMA   05/21/2022   Nurse Notes: none

## 2022-05-21 NOTE — Assessment & Plan Note (Signed)
Recent flare due to nsaid use - symptoms improving Continue PPI for one month or longer if needed then PRN

## 2022-05-21 NOTE — Assessment & Plan Note (Signed)
Clinically stable exam with well controlled BP on losartan and hctz. Tolerating medications without side effects. Pt to continue current regimen and low sodium diet.  

## 2022-05-21 NOTE — Progress Notes (Signed)
Date:  05/21/2022   Name:  Lynn Tapia   DOB:  1955/08/20   MRN:  [id number]   Chief Complaint: Hypertension  Hypertension This is a chronic problem. The problem is controlled. Pertinent negatives include no chest pain, headaches, palpitations or shortness of breath. Past treatments include angiotensin blockers and diuretics. The current treatment provides significant improvement.  Gastroesophageal Reflux She complains of heartburn. She reports no abdominal pain, no chest pain, no dysphagia, no sore throat or no water brash. This is a recurrent problem. The problem occurs frequently. The problem has been gradually improving. Exacerbated by: was taking Advil for shoulder surgery. Pertinent negatives include no fatigue. She has tried a PPI for the symptoms.  Gout - in left big toe last week.  Resolved after several days of colchicine. Obesity - she is interested in medical weight loss.  Will refer.  Lab Results  Component Value Date   NA 142 03/12/2022   K 3.8 03/12/2022   CO2 29 03/12/2022   GLUCOSE 87 03/12/2022   BUN 16 03/12/2022   CREATININE 0.94 03/12/2022   CALCIUM 9.8 03/12/2022   EGFR 67 03/12/2022   No results found for: "CHOL", "HDL", "LDLCALC", "LDLDIRECT", "TRIG", "CHOLHDL" No results found for: "TSH" No results found for: "HGBA1C" No results found for: "WBC", "HGB", "HCT", "MCV", "PLT" No results found for: "ALT", "AST", "GGT", "ALKPHOS", "BILITOT" No results found for: "25OHVITD2", "25OHVITD3", "VD25OH"   Review of Systems  Constitutional:  Positive for unexpected weight change. Negative for chills, fatigue and fever.  HENT:  Negative for sore throat and trouble swallowing.   Respiratory:  Negative for chest tightness and shortness of breath.   Cardiovascular:  Negative for chest pain, palpitations and leg swelling.  Gastrointestinal:  Positive for heartburn. Negative for abdominal pain, constipation and dysphagia.  Musculoskeletal:  Positive for arthralgias,  gait problem and joint swelling.  Neurological:  Negative for dizziness, light-headedness and headaches.  Psychiatric/Behavioral:  Negative for dysphoric mood and sleep disturbance. The patient is not nervous/anxious.     Patient Active Problem List   Diagnosis Date Noted   Gouty arthritis 05/21/2022   Gastroesophageal reflux disease 05/21/2022   BMI 35.0-35.9,adult 05/21/2022   Gastritis 05/13/2022   Superior labrum anterior-to-posterior (SLAP) tear of right shoulder 02/11/2022   Cervical spondylosis 05/22/2021   Family history of breast cancer in first degree relative 09/16/2020   Migraine with aura and without status migrainosus, not intractable 09/16/2020   Mixed hyperlipidemia 03/09/2018   Asymptomatic microscopic hematuria 04/29/2016   Chronic idiopathic constipation 04/20/2016   Internal hemorrhoids with complication 09/17/2015   Rectocele 09/17/2015   History of adenomatous polyp of colon 03/06/2015   Hypertensive heart disease without CHF 11/28/2014   Primary hypertension 11/26/2012    Allergies  Allergen Reactions   Penicillins     Reports this happened at 61 months old    Sulfa Antibiotics Rash   Telmisartan Other (See Comments)    Patient reports nightmares     Past Surgical History:  Procedure Laterality Date   ABDOMINAL HYSTERECTOMY  03/2008   ANUS SURGERY  12/2018   CHOLECYSTECTOMY  2002   at [facility] ARTHROSCOPY WITH SUBACROMIAL DECOMPRESSION, ROTATOR CUFF REPAIR AND BICEP TENDON REPAIR Right 02/11/2022   Procedure: SHOULDER ARTHROSCOPY WITH DEBRIDEMENT, DECOMPRESSION, ROTATOR CUFF REPAIR AND BIPEPS TENODESIS.;  Surgeon: Christena Flake, MD;  Location: [facility] ORS;  Service: Orthopedics;  Laterality: Right;    Social History   Tobacco Use   Smoking status:  Never   Smokeless tobacco: Never  Vaping Use   Vaping Use: Never used  Substance Use Topics   Alcohol use: Never   Drug use: Never     Medication list has been reviewed and updated.  Current  Meds  Medication Sig   acyclovir ointment (ZOVIRAX) 5 % Apply 1 Application topically every 3 (three) hours.   ascorbic acid (VITAMIN C) 1000 MG tablet Take 1,000 mg by mouth daily.   chlorthalidone (HYGROTON) 25 MG tablet Take 25 mg by mouth daily.   cholecalciferol (VITAMIN D3) 25 MCG (1000 UNIT) tablet Take 1,000 Units by mouth daily.   cyanocobalamin (VITAMIN B12) 1000 MCG tablet Take 1,000 mcg by mouth daily.   docusate sodium (COLACE) 100 MG capsule Take 100 mg by mouth 2 (two) times daily.   fluocinonide cream (LIDEX) 0.05 % Apply 1 Application topically 2 (two) times daily.   fluticasone (FLONASE) 50 MCG/ACT nasal spray INSTILL 2 SPRAYS BY EACH NARE ROUTE DAILY.   losartan (COZAAR) 25 MG tablet Take 25 mg by mouth daily.   magnesium oxide (MAG-OX) 400 (240 Mg) MG tablet Take 400 mg by mouth daily.   pantoprazole (PROTONIX) 40 MG tablet Take 1 tablet (40 mg total) by mouth daily.   polyethylene glycol (MIRALAX / GLYCOLAX) 17 g packet Take 17 g by mouth daily.   potassium chloride (KLOR-CON M10) 10 MEQ tablet Take 20 mEq by mouth 2 (two) times daily.   SUMAtriptan (IMITREX) 50 MG tablet Take 50 mg by mouth every 2 (two) hours as needed for migraine. May repeat in 2 hours if headache persists or recurs.       05/21/2022   10:44 AM 03/12/2022    2:23 PM  GAD 7 : Generalized Anxiety Score  Nervous, Anxious, on Edge 0 0  Control/stop worrying 0 0  Worry too much - different things 0 0  Trouble relaxing 0 0  Restless 0 0  Easily annoyed or irritable 0 0  Afraid - awful might happen 0 0  Total GAD 7 Score 0 0  Anxiety Difficulty Not difficult at all Not difficult at all       05/21/2022   10:48 AM 05/21/2022   10:44 AM 03/12/2022    2:23 PM  Depression screen PHQ 2/9  Decreased Interest 0 0 0  Down, Depressed, Hopeless 0 0 0  PHQ - 2 Score 0 0 0  Altered sleeping 0 0 0  Tired, decreased energy 0 0 0  Change in appetite 0 0 0  Feeling bad or failure about yourself  0 0 0   Trouble concentrating 0 0 0  Moving slowly or fidgety/restless 0 0 0  Suicidal thoughts 0 0 0  PHQ-9 Score 0 0 0  Difficult doing work/chores Not difficult at all Not difficult at all Not difficult at all    BP Readings from Last 3 Encounters:  05/21/22 128/76  05/21/22 128/76  03/12/22 124/76    Physical Exam Vitals and nursing note reviewed.  Constitutional:      General: She is not in acute distress.    Appearance: Normal appearance. She is well-developed. She is obese.  HENT:     Head: Normocephalic and atraumatic.  Cardiovascular:     Rate and Rhythm: Normal rate and regular rhythm.  Pulmonary:     Effort: Pulmonary effort is normal. No respiratory distress.     Breath sounds: No wheezing or rhonchi.  Musculoskeletal:        General: Swelling (left  first MTP) present.     Cervical back: Normal range of motion.     Right lower leg: No edema.     Left lower leg: No edema.  Lymphadenopathy:     Cervical: No cervical adenopathy.  Skin:    General: Skin is warm and dry.     Findings: No rash.  Neurological:     General: No focal deficit present.     Mental Status: She is alert and oriented to person, place, and time.  Psychiatric:        Mood and Affect: Mood normal.        Behavior: Behavior normal.     Wt Readings from Last 3 Encounters:  05/21/22 222 lb (100.7 kg)  05/21/22 222 lb (100.7 kg)  03/12/22 216 lb (98 kg)    BP 128/76   Pulse 67   Ht 5' 6.5" (1.689 m)   Wt 222 lb (100.7 kg)   SpO2 98%   BMI 35.29 kg/m   Assessment and Plan:  Problem List Items Addressed This Visit       Cardiovascular and Mediastinum   Hypertensive heart disease without CHF (Chronic)   Primary hypertension - Primary (Chronic)    Clinically stable exam with well controlled BP on losartan and hctz. Tolerating medications without side effects. Pt to continue current regimen and low sodium diet.       Relevant Orders   Basic metabolic panel   CBC with  Differential/Platelet     Digestive   Gastroesophageal reflux disease    Recent flare due to nsaid use - symptoms improving Continue PPI for one month or longer if needed then PRN      Relevant Orders   CBC with Differential/Platelet     Musculoskeletal and Integument   Gouty arthritis    Improved with colchicine Continue colchicine PRN Will check UA      Relevant Orders   Uric acid     Other   BMI 35.0-35.9,adult   Relevant Orders   Amb Ref to Medical Weight Management   Other Visit Diagnoses     Need for vaccination for pneumococcus       Relevant Orders   Pneumococcal conjugate vaccine 20-valent (Completed)       Return in about 4 months (around 09/20/2022) for CPX.   MAW completed by CMA.  Partially dictated using Dragon software, any errors are not intentional.  Reubin Milan, MD [facility] [place], [place]

## 2022-05-21 NOTE — Assessment & Plan Note (Signed)
Improved with colchicine Continue colchicine PRN Will check UA

## 2022-05-22 LAB — CBC WITH DIFFERENTIAL/PLATELET
Basophils Absolute: 0.1 10*3/uL (ref 0.0–0.2)
Basos: 1 %
EOS (ABSOLUTE): 0.4 10*3/uL (ref 0.0–0.4)
Eos: 5 %
Hematocrit: 38.6 % (ref 34.0–46.6)
Hemoglobin: 13.3 g/dL (ref 11.1–15.9)
Immature Grans (Abs): 0 10*3/uL (ref 0.0–0.1)
Immature Granulocytes: 0 %
Lymphocytes Absolute: 3.5 10*3/uL — ABNORMAL HIGH (ref 0.7–3.1)
Lymphs: 45 %
MCH: 31.2 pg (ref 26.6–33.0)
MCHC: 34.5 g/dL (ref 31.5–35.7)
MCV: 91 fL (ref 79–97)
Monocytes Absolute: 0.9 10*3/uL (ref 0.1–0.9)
Monocytes: 11 %
Neutrophils Absolute: 2.9 10*3/uL (ref 1.4–7.0)
Neutrophils: 38 %
Platelets: 250 10*3/uL (ref 150–450)
RBC: 4.26 x10E6/uL (ref 3.77–5.28)
RDW: 13 % (ref 11.7–15.4)
WBC: 7.7 10*3/uL (ref 3.4–10.8)

## 2022-05-22 LAB — BASIC METABOLIC PANEL
BUN/Creatinine Ratio: 14 (ref 12–28)
BUN: 13 mg/dL (ref 8–27)
CO2: 27 mmol/L (ref 20–29)
Calcium: 9.8 mg/dL (ref 8.7–10.3)
Chloride: 98 mmol/L (ref 96–106)
Creatinine, Ser: 0.9 mg/dL (ref 0.57–1.00)
Glucose: 87 mg/dL (ref 70–99)
Potassium: 3.7 mmol/L (ref 3.5–5.2)
Sodium: 144 mmol/L (ref 134–144)
eGFR: 71 mL/min/{1.73_m2} (ref >59)

## 2022-05-22 LAB — URIC ACID: Uric Acid: 7.8 mg/dL — ABNORMAL HIGH (ref 3.0–7.2)

## 2022-05-25 DIAGNOSIS — M75111 Incomplete rotator cuff tear or rupture of right shoulder, not specified as traumatic: Secondary | ICD-10-CM | POA: Diagnosis not present

## 2022-05-25 DIAGNOSIS — M25511 Pain in right shoulder: Secondary | ICD-10-CM | POA: Diagnosis not present

## 2022-05-25 DIAGNOSIS — R293 Abnormal posture: Secondary | ICD-10-CM | POA: Diagnosis not present

## 2022-05-27 DIAGNOSIS — M75111 Incomplete rotator cuff tear or rupture of right shoulder, not specified as traumatic: Secondary | ICD-10-CM | POA: Diagnosis not present

## 2022-05-27 DIAGNOSIS — R293 Abnormal posture: Secondary | ICD-10-CM | POA: Diagnosis not present

## 2022-05-27 DIAGNOSIS — M25511 Pain in right shoulder: Secondary | ICD-10-CM | POA: Diagnosis not present

## 2022-05-30 DIAGNOSIS — M109 Gout, unspecified: Secondary | ICD-10-CM | POA: Insufficient documentation

## 2022-05-30 NOTE — Assessment & Plan Note (Signed)
Acute flare of chronic gout localizing to 1st great toe MTP.   - Colchicine course - GI prophylaxis given comorbid GERD - Return as-needed

## 2022-06-01 DIAGNOSIS — R5383 Other fatigue: Secondary | ICD-10-CM | POA: Diagnosis not present

## 2022-06-01 DIAGNOSIS — R293 Abnormal posture: Secondary | ICD-10-CM | POA: Diagnosis not present

## 2022-06-01 DIAGNOSIS — M75111 Incomplete rotator cuff tear or rupture of right shoulder, not specified as traumatic: Secondary | ICD-10-CM | POA: Diagnosis not present

## 2022-06-01 DIAGNOSIS — M25511 Pain in right shoulder: Secondary | ICD-10-CM | POA: Diagnosis not present

## 2022-06-01 DIAGNOSIS — R0609 Other forms of dyspnea: Secondary | ICD-10-CM | POA: Diagnosis not present

## 2022-06-01 DIAGNOSIS — I10 Essential (primary) hypertension: Secondary | ICD-10-CM | POA: Diagnosis not present

## 2022-06-03 DIAGNOSIS — R293 Abnormal posture: Secondary | ICD-10-CM | POA: Diagnosis not present

## 2022-06-03 DIAGNOSIS — M25511 Pain in right shoulder: Secondary | ICD-10-CM | POA: Diagnosis not present

## 2022-06-03 DIAGNOSIS — M75111 Incomplete rotator cuff tear or rupture of right shoulder, not specified as traumatic: Secondary | ICD-10-CM | POA: Diagnosis not present

## 2022-06-04 ENCOUNTER — Other Ambulatory Visit: Payer: Self-pay | Admitting: Family Medicine

## 2022-06-04 DIAGNOSIS — K2900 Acute gastritis without bleeding: Secondary | ICD-10-CM

## 2022-06-04 NOTE — Telephone Encounter (Signed)
Requested Prescriptions  Pending Prescriptions Disp Refills   pantoprazole (PROTONIX) 40 MG tablet [Pharmacy Med Name: PANTOPRAZOLE SOD DR 40 MG TAB] 90 tablet 0    Sig: TAKE 1 TABLET BY MOUTH EVERY DAY     Gastroenterology: Proton Pump Inhibitors Passed - 06/04/2022 10:31 AM      Passed - Valid encounter within last 12 months    Recent Outpatient Visits           2 weeks ago Primary hypertension   [facility] at [facility], Nyoka Cowden, MD   3 weeks ago Gouty arthritis of left great toe   [facility] at [facility], Ocie Bob, MD   2 months ago Hypertensive heart disease without CHF   [facility] at [facility], Nyoka Cowden, MD       Future Appointments             In 3 days Judithann Graves Nyoka Cowden, MD [facility] at [facility], [facility]   In 4 months Judithann Graves, Nyoka Cowden, MD [facility] at [facility]

## 2022-06-07 ENCOUNTER — Ambulatory Visit (INDEPENDENT_AMBULATORY_CARE_PROVIDER_SITE_OTHER): Payer: PPO | Admitting: Internal Medicine

## 2022-06-07 ENCOUNTER — Encounter: Payer: Self-pay | Admitting: Internal Medicine

## 2022-06-07 VITALS — BP 126/60 | HR 65 | Ht 66.0 in | Wt 222.6 lb

## 2022-06-07 DIAGNOSIS — R5383 Other fatigue: Secondary | ICD-10-CM

## 2022-06-07 DIAGNOSIS — R739 Hyperglycemia, unspecified: Secondary | ICD-10-CM

## 2022-06-07 DIAGNOSIS — I10 Essential (primary) hypertension: Secondary | ICD-10-CM

## 2022-06-07 DIAGNOSIS — E538 Deficiency of other specified B group vitamins: Secondary | ICD-10-CM

## 2022-06-07 NOTE — Progress Notes (Signed)
Date:  06/07/2022   Name:  Lynn Tapia   DOB:  [birth year]   MRN:  [id number]   Chief Complaint: Fatigue (Patient seen Cardiology last week and they do not feel the fatigue has anything to do with patients heart. Wants thyroid labs. Started in January after shoulder surgery. )  HPI Fatigue - she had shoulder surgery in January and has not been very active.  She has gained about 12 lbs.  Since recovering partially, she has been more active but feels fatigued.  She rarely naps.  She sleep well, no snoring or daytime sleepiness.  No chest pain, shortness of breath, HA, dizziness.  She was seen recently by Cardiology who did not find a cardiac reason. She has history of B12 deficiency last year.  Taking an oral supplement but no repeat labs.  Lab Results  Component Value Date   NA 144 05/21/2022   K 3.7 05/21/2022   CO2 27 05/21/2022   GLUCOSE 87 05/21/2022   BUN 13 05/21/2022   CREATININE 0.90 05/21/2022   CALCIUM 9.8 05/21/2022   EGFR 71 05/21/2022   No results found for: "CHOL", "HDL", "LDLCALC", "LDLDIRECT", "TRIG", "CHOLHDL" No results found for: "TSH" No results found for: "HGBA1C" Lab Results  Component Value Date   WBC 7.7 05/21/2022   HGB 13.3 05/21/2022   HCT 38.6 05/21/2022   MCV 91 05/21/2022   PLT 250 05/21/2022   No results found for: "ALT", "AST", "GGT", "ALKPHOS", "BILITOT" No results found for: "25OHVITD2", "25OHVITD3", "VD25OH"   Review of Systems  Constitutional:  Positive for fatigue and unexpected weight change. Negative for chills and fever.  HENT:  Negative for trouble swallowing.   Respiratory:  Negative for chest tightness and shortness of breath.   Cardiovascular:  Negative for chest pain and leg swelling.  Musculoskeletal:  Positive for arthralgias (right shoulder healing).  Neurological:  Negative for dizziness, light-headedness and headaches.  Psychiatric/Behavioral:  Negative for dysphoric mood and sleep disturbance. The patient is not  nervous/anxious.     Patient Active Problem List   Diagnosis Date Noted   B12 deficiency 06/07/2022   Gouty arthritis of left great toe 05/30/2022   Gouty arthritis 05/21/2022   Gastroesophageal reflux disease 05/21/2022   BMI 35.0-35.9,adult 05/21/2022   Gastritis 05/13/2022   Superior labrum anterior-to-posterior (SLAP) tear of right shoulder 02/11/2022   Cervical spondylosis 05/22/2021   Family history of breast cancer in first degree relative 09/16/2020   Migraine with aura and without status migrainosus, not intractable 09/16/2020   Mixed hyperlipidemia 03/09/2018   Asymptomatic microscopic hematuria 04/29/2016   Chronic idiopathic constipation 04/20/2016   Internal hemorrhoids with complication 09/17/2015   Rectocele 09/17/2015   History of adenomatous polyp of colon 03/06/2015   Hypertensive heart disease without CHF 11/28/2014   Primary hypertension 11/26/2012    Allergies  Allergen Reactions   Penicillins     Reports this happened at 95 months old    Sulfa Antibiotics Rash   Telmisartan Other (See Comments)    Patient reports nightmares     Past Surgical History:  Procedure Laterality Date   ABDOMINAL HYSTERECTOMY  03/2008   ANUS SURGERY  12/2018   CHOLECYSTECTOMY  2002   at [facility] ARTHROSCOPY WITH SUBACROMIAL DECOMPRESSION, ROTATOR CUFF REPAIR AND BICEP TENDON REPAIR Right 02/11/2022   Procedure: SHOULDER ARTHROSCOPY WITH DEBRIDEMENT, DECOMPRESSION, ROTATOR CUFF REPAIR AND BIPEPS TENODESIS.;  Surgeon: Christena Flake, MD;  Location: [facility] ORS;  Service: Orthopedics;  Laterality: Right;  Social History   Tobacco Use   Smoking status: Never   Smokeless tobacco: Never  Vaping Use   Vaping Use: Never used  Substance Use Topics   Alcohol use: Never   Drug use: Never     Medication list has been reviewed and updated.  Current Meds  Medication Sig   acyclovir ointment (ZOVIRAX) 5 % Apply 1 Application topically every 3 (three) hours.   ascorbic  acid (VITAMIN C) 1000 MG tablet Take 1,000 mg by mouth daily.   chlorthalidone (HYGROTON) 25 MG tablet Take 25 mg by mouth daily.   cholecalciferol (VITAMIN D3) 25 MCG (1000 UNIT) tablet Take 1,000 Units by mouth daily.   colchicine 0.6 MG tablet Take 3 tablets (1.8 mg total) by mouth daily for 1 day, THEN 1 tablet (0.6 mg total) daily for 10 days. Stop medication 48 hours after flare resolves.   cyanocobalamin (VITAMIN B12) 1000 MCG tablet Take 1,000 mcg by mouth daily.   docusate sodium (COLACE) 100 MG capsule Take 100 mg by mouth 2 (two) times daily.   fluocinonide cream (LIDEX) 0.05 % Apply 1 Application topically 2 (two) times daily.   fluticasone (FLONASE) 50 MCG/ACT nasal spray INSTILL 2 SPRAYS BY EACH NARE ROUTE DAILY.   losartan (COZAAR) 25 MG tablet Take 25 mg by mouth daily.   magnesium oxide (MAG-OX) 400 (240 Mg) MG tablet Take 400 mg by mouth daily.   pantoprazole (PROTONIX) 40 MG tablet TAKE 1 TABLET BY MOUTH EVERY DAY   polyethylene glycol (MIRALAX / GLYCOLAX) 17 g packet Take 17 g by mouth daily.   potassium chloride (KLOR-CON M10) 10 MEQ tablet Take 20 mEq by mouth 2 (two) times daily.   SUMAtriptan (IMITREX) 50 MG tablet Take 50 mg by mouth every 2 (two) hours as needed for migraine. May repeat in 2 hours if headache persists or recurs.       05/21/2022   10:44 AM 03/12/2022    2:23 PM  GAD 7 : Generalized Anxiety Score  Nervous, Anxious, on Edge 0 0  Control/stop worrying 0 0  Worry too much - different things 0 0  Trouble relaxing 0 0  Restless 0 0  Easily annoyed or irritable 0 0  Afraid - awful might happen 0 0  Total GAD 7 Score 0 0  Anxiety Difficulty Not difficult at all Not difficult at all       05/21/2022   10:48 AM 05/21/2022   10:44 AM 03/12/2022    2:23 PM  Depression screen PHQ 2/9  Decreased Interest 0 0 0  Down, Depressed, Hopeless 0 0 0  PHQ - 2 Score 0 0 0  Altered sleeping 0 0 0  Tired, decreased energy 0 0 0  Change in appetite 0 0 0  Feeling  bad or failure about yourself  0 0 0  Trouble concentrating 0 0 0  Moving slowly or fidgety/restless 0 0 0  Suicidal thoughts 0 0 0  PHQ-9 Score 0 0 0  Difficult doing work/chores Not difficult at all Not difficult at all Not difficult at all    BP Readings from Last 3 Encounters:  06/07/22 126/60  05/21/22 128/76  05/21/22 128/76    Physical Exam Vitals and nursing note reviewed.  Constitutional:      General: She is not in acute distress.    Appearance: Normal appearance. She is well-developed.  HENT:     Head: Normocephalic and atraumatic.  Cardiovascular:     Rate and Rhythm: Normal  rate and regular rhythm.  Pulmonary:     Effort: Pulmonary effort is normal. No respiratory distress.     Breath sounds: No wheezing or rhonchi.  Musculoskeletal:     Cervical back: Normal range of motion.     Right lower leg: No edema.     Left lower leg: No edema.  Lymphadenopathy:     Cervical: No cervical adenopathy.  Skin:    General: Skin is warm and dry.     Findings: No rash.  Neurological:     General: No focal deficit present.     Mental Status: She is alert and oriented to person, place, and time.  Psychiatric:        Mood and Affect: Mood normal.        Behavior: Behavior normal.        06/07/2022   11:16 AM  Results of the Epworth flowsheet  Sitting and reading 1  Watching TV 1  Sitting, inactive in a public place (e.g. a theatre or a meeting) 0  As a passenger in a car for an hour without a break 0  Lying down to rest in the afternoon when circumstances permit 2  Sitting and talking to someone 0  Sitting quietly after a lunch without alcohol 0  In a car, while stopped for a few minutes in traffic 0  Total score 4    Wt Readings from Last 3 Encounters:  06/07/22 222 lb 9.6 oz (101 kg)  05/21/22 222 lb (100.7 kg)  05/21/22 222 lb (100.7 kg)    BP 126/60   Pulse 65   Ht 5\' 6"  (1.676 m)   Wt 222 lb 9.6 oz (101 kg)   SpO2 97%   BMI 35.93 kg/m    Assessment and Plan:  Problem List Items Addressed This Visit       Other   B12 deficiency    Continue oral supplementation Will check levels      Relevant Orders   Vitamin B12   Other Visit Diagnoses     Fatigue, unspecified type    -  Primary   Recent normal CBC at Cardiology would hold off on repeat ECHO since this was normal in December. Likely etiology is weight gain and decondtioning.   Relevant Orders   TSH + free T4   Comprehensive metabolic panel   Elevated serum glucose       rule out DM   Relevant Orders   Hemoglobin A1c       No follow-ups on file.   Partially dictated using Dragon software, any errors are not intentional.  Reubin Milan, MD [facility] [place], [place]

## 2022-06-07 NOTE — Assessment & Plan Note (Signed)
Continue oral supplementation Will check levels

## 2022-06-07 NOTE — Patient Instructions (Signed)
-  It was a pleasure to see you today! Please review your visit summary for helpful information. -Lab results are usually available within 1-2 days and we will call once reviewed. -I would encourage you to follow your care via MyChart where you can access lab results, notes, messages, and more. -If you feel that we did a nice job today, please complete your after-visit survey and leave us a Google review! Your CMA today was Iveliz Garay and your provider was Dr Laura Berglund, MD.  

## 2022-06-08 LAB — COMPREHENSIVE METABOLIC PANEL
ALT: 29 IU/L (ref 0–32)
AST: 26 IU/L (ref 0–40)
Albumin/Globulin Ratio: 1.7 (ref 1.2–2.2)
Albumin: 4.6 g/dL (ref 3.9–4.9)
Alkaline Phosphatase: 58 IU/L (ref 44–121)
BUN/Creatinine Ratio: 17 (ref 12–28)
BUN: 16 mg/dL (ref 8–27)
Bilirubin Total: 0.3 mg/dL (ref 0.0–1.2)
CO2: 25 mmol/L (ref 20–29)
Calcium: 9.9 mg/dL (ref 8.7–10.3)
Chloride: 96 mmol/L (ref 96–106)
Creatinine, Ser: 0.92 mg/dL (ref 0.57–1.00)
Globulin, Total: 2.7 g/dL (ref 1.5–4.5)
Glucose: 102 mg/dL — ABNORMAL HIGH (ref 70–99)
Potassium: 4 mmol/L (ref 3.5–5.2)
Sodium: 139 mmol/L (ref 134–144)
Total Protein: 7.3 g/dL (ref 6.0–8.5)
eGFR: 69 mL/min/{1.73_m2} (ref >59)

## 2022-06-08 LAB — HEMOGLOBIN A1C
Est. average glucose Bld gHb Est-mCnc: 128 mg/dL
Hgb A1c MFr Bld: 6.1 % — ABNORMAL HIGH (ref 4.8–5.6)

## 2022-06-08 LAB — TSH+FREE T4
Free T4: 0.81 ng/dL — ABNORMAL LOW (ref 0.82–1.77)
TSH: 2.27 u[IU]/mL (ref 0.450–4.500)

## 2022-06-08 LAB — VITAMIN B12: Vitamin B-12: 1292 pg/mL — ABNORMAL HIGH (ref 232–1245)

## 2022-06-09 ENCOUNTER — Telehealth: Payer: Self-pay | Admitting: Internal Medicine

## 2022-06-09 NOTE — Telephone Encounter (Signed)
Patient informed we do not know of a clinic in [place] for weight loss unless she is looking for bariatric surgery. She declined, and she would like to close referral and work on her weight on her own.  - Lynn Tapia

## 2022-06-09 NOTE — Telephone Encounter (Signed)
Pt is calling to request a referral to weight loss in [place] rather in [place]. CB- [phone]

## 2022-06-10 DIAGNOSIS — M25511 Pain in right shoulder: Secondary | ICD-10-CM | POA: Diagnosis not present

## 2022-06-10 DIAGNOSIS — M75111 Incomplete rotator cuff tear or rupture of right shoulder, not specified as traumatic: Secondary | ICD-10-CM | POA: Diagnosis not present

## 2022-06-10 DIAGNOSIS — R293 Abnormal posture: Secondary | ICD-10-CM | POA: Diagnosis not present

## 2022-06-18 DIAGNOSIS — M75111 Incomplete rotator cuff tear or rupture of right shoulder, not specified as traumatic: Secondary | ICD-10-CM | POA: Diagnosis not present

## 2022-06-18 DIAGNOSIS — R293 Abnormal posture: Secondary | ICD-10-CM | POA: Diagnosis not present

## 2022-06-18 DIAGNOSIS — M25511 Pain in right shoulder: Secondary | ICD-10-CM | POA: Diagnosis not present

## 2022-06-25 DIAGNOSIS — M25511 Pain in right shoulder: Secondary | ICD-10-CM | POA: Diagnosis not present

## 2022-06-25 DIAGNOSIS — M75111 Incomplete rotator cuff tear or rupture of right shoulder, not specified as traumatic: Secondary | ICD-10-CM | POA: Diagnosis not present

## 2022-06-25 DIAGNOSIS — R293 Abnormal posture: Secondary | ICD-10-CM | POA: Diagnosis not present

## 2022-06-29 DIAGNOSIS — M75111 Incomplete rotator cuff tear or rupture of right shoulder, not specified as traumatic: Secondary | ICD-10-CM | POA: Diagnosis not present

## 2022-06-29 DIAGNOSIS — R293 Abnormal posture: Secondary | ICD-10-CM | POA: Diagnosis not present

## 2022-06-29 DIAGNOSIS — M25511 Pain in right shoulder: Secondary | ICD-10-CM | POA: Diagnosis not present

## 2022-07-02 ENCOUNTER — Telehealth: Payer: Self-pay | Admitting: Internal Medicine

## 2022-07-02 ENCOUNTER — Other Ambulatory Visit: Payer: Self-pay | Admitting: Internal Medicine

## 2022-07-02 DIAGNOSIS — M109 Gout, unspecified: Secondary | ICD-10-CM

## 2022-07-02 MED ORDER — COLCHICINE 0.6 MG PO TABS
0.6000 mg | ORAL_TABLET | Freq: Two times a day (BID) | ORAL | 0 refills | Status: DC
Start: 2022-07-02 — End: 2022-07-27

## 2022-07-02 NOTE — Telephone Encounter (Signed)
Please review.  KP

## 2022-07-02 NOTE — Telephone Encounter (Signed)
Pt is calling in because she is [place] and is currently out of Gout medication and would like a prescription for  colchicine 0.6 MG tablet [[id number]]  sent over to [facility]: [address] [place] [address]. Pt says she will be in [place] for the rest of the week and she is currently having a flare up and is in pain from the gout.

## 2022-07-02 NOTE — Telephone Encounter (Signed)
Patient informed it was sent.   - Lynn Tapia

## 2022-07-27 ENCOUNTER — Encounter: Payer: Self-pay | Admitting: Internal Medicine

## 2022-07-27 ENCOUNTER — Ambulatory Visit (INDEPENDENT_AMBULATORY_CARE_PROVIDER_SITE_OTHER): Payer: PPO | Admitting: Internal Medicine

## 2022-07-27 VITALS — BP 114/78 | HR 69 | Ht 66.0 in | Wt 221.0 lb

## 2022-07-27 DIAGNOSIS — M109 Gout, unspecified: Secondary | ICD-10-CM

## 2022-07-27 DIAGNOSIS — K649 Unspecified hemorrhoids: Secondary | ICD-10-CM

## 2022-07-27 MED ORDER — HYDROCORTISONE (PERIANAL) 2.5 % EX CREA
1.0000 | TOPICAL_CREAM | Freq: Two times a day (BID) | CUTANEOUS | 2 refills | Status: DC
Start: 2022-07-27 — End: 2023-05-16

## 2022-07-27 MED ORDER — ALLOPURINOL 100 MG PO TABS
100.0000 mg | ORAL_TABLET | Freq: Every day | ORAL | 1 refills | Status: DC
Start: 2022-07-27 — End: 2022-10-29

## 2022-07-27 MED ORDER — COLCHICINE 0.6 MG PO TABS: 0.6000 mg | ORAL_TABLET | Freq: Two times a day (BID) | ORAL | 0 refills | Status: DC

## 2022-07-27 MED ORDER — PREDNISONE 10 MG PO TABS
ORAL_TABLET | ORAL | 0 refills | Status: DC
Start: 2022-07-27 — End: 2022-12-29

## 2022-07-27 NOTE — Assessment & Plan Note (Addendum)
Recurrent symptoms in left big toe despite 2 rounds of colchicine bid x 5 days. Will prednisone taper. Will start allopurinol after current episode has resolved.

## 2022-07-27 NOTE — Progress Notes (Signed)
Date:  07/27/2022   Name:  Lynn Tapia   DOB:  12-Oct-1955   MRN:  [id number]   Chief Complaint: Gout (Left big toe), Rectal Pain (Comes and goes, uses creams that help some had surgery years ago, uncomfortable ), and Edema (X 2 days,Right index finger, painful to bend)  Toe Pain  There was no injury mechanism. The quality of the pain is described as aching and burning. The pain is moderate. The pain has been Fluctuating since onset. Treatments tried: started with a gout flare 3+ weeks ago.  2 rounds of colchicine with 95% improvement.    Lab Results  Component Value Date   NA 139 06/07/2022   K 4.0 06/07/2022   CO2 25 06/07/2022   GLUCOSE 102 (H) 06/07/2022   BUN 16 06/07/2022   CREATININE 0.92 06/07/2022   CALCIUM 9.9 06/07/2022   EGFR 69 06/07/2022   No results found for: "CHOL", "HDL", "LDLCALC", "LDLDIRECT", "TRIG", "CHOLHDL" Lab Results  Component Value Date   TSH 2.270 06/07/2022   Lab Results  Component Value Date   HGBA1C 6.1 (H) 06/07/2022   Lab Results  Component Value Date   WBC 7.7 05/21/2022   HGB 13.3 05/21/2022   HCT 38.6 05/21/2022   MCV 91 05/21/2022   PLT 250 05/21/2022   Lab Results  Component Value Date   ALT 29 06/07/2022   AST 26 06/07/2022   ALKPHOS 58 06/07/2022   BILITOT 0.3 06/07/2022   No results found for: "25OHVITD2", "25OHVITD3", "VD25OH"   Review of Systems  Constitutional:  Negative for chills, fatigue and fever.  Respiratory:  Negative for chest tightness and shortness of breath.   Cardiovascular:  Negative for chest pain.  Gastrointestinal:        Rectal itching and hemorrhoidal irritation  Musculoskeletal:  Positive for arthralgias and joint swelling. Negative for gait problem and myalgias.    Patient Active Problem List   Diagnosis Date Noted   B12 deficiency 06/07/2022   Gouty arthritis of left great toe 05/30/2022   Gouty arthritis 05/21/2022   Gastroesophageal reflux disease 05/21/2022   BMI 35.0-35.9,adult  05/21/2022   Gastritis 05/13/2022   Superior labrum anterior-to-posterior (SLAP) tear of right shoulder 02/11/2022   Cervical spondylosis 05/22/2021   Family history of breast cancer in first degree relative 09/16/2020   Migraine with aura and without status migrainosus, not intractable 09/16/2020   Mixed hyperlipidemia 03/09/2018   Asymptomatic microscopic hematuria 04/29/2016   Chronic idiopathic constipation 04/20/2016   Internal hemorrhoids with complication 09/17/2015   Rectocele 09/17/2015   History of adenomatous polyp of colon 03/06/2015   Hypertensive heart disease without CHF 11/28/2014   Primary hypertension 11/26/2012    Allergies  Allergen Reactions   Penicillins     Reports this happened at 51 months old    Sulfa Antibiotics Rash   Telmisartan Other (See Comments)    Patient reports nightmares     Past Surgical History:  Procedure Laterality Date   ABDOMINAL HYSTERECTOMY  03/2008   ANUS SURGERY  12/2018   CHOLECYSTECTOMY  2002   at [facility] ARTHROSCOPY WITH SUBACROMIAL DECOMPRESSION, ROTATOR CUFF REPAIR AND BICEP TENDON REPAIR Right 02/11/2022   Procedure: SHOULDER ARTHROSCOPY WITH DEBRIDEMENT, DECOMPRESSION, ROTATOR CUFF REPAIR AND BIPEPS TENODESIS.;  Surgeon: Christena Flake, MD;  Location: [facility] ORS;  Service: Orthopedics;  Laterality: Right;    Social History   Tobacco Use   Smoking status: Never   Smokeless tobacco: Never  Vaping Use  Vaping Use: Never used  Substance Use Topics   Alcohol use: Never   Drug use: Never     Medication list has been reviewed and updated.  Current Meds  Medication Sig   acyclovir ointment (ZOVIRAX) 5 % Apply 1 Application topically every 3 (three) hours.   allopurinol (ZYLOPRIM) 100 MG tablet Take 1 tablet (100 mg total) by mouth daily.   ascorbic acid (VITAMIN C) 1000 MG tablet Take 1,000 mg by mouth daily.   chlorthalidone (HYGROTON) 25 MG tablet Take 25 mg by mouth daily.   cholecalciferol (VITAMIN D3) 25 MCG  (1000 UNIT) tablet Take 1,000 Units by mouth daily.   cyanocobalamin (VITAMIN B12) 1000 MCG tablet Take 1,000 mcg by mouth daily.   docusate sodium (COLACE) 100 MG capsule Take 100 mg by mouth 2 (two) times daily.   fluocinonide cream (LIDEX) 0.05 % Apply 1 Application topically 2 (two) times daily.   fluticasone (FLONASE) 50 MCG/ACT nasal spray INSTILL 2 SPRAYS BY EACH NARE ROUTE DAILY.   hydrocortisone (ANUSOL-HC) 2.5 % rectal cream Place 1 Application rectally 2 (two) times daily.   losartan (COZAAR) 25 MG tablet Take 25 mg by mouth daily.   magnesium oxide (MAG-OX) 400 (240 Mg) MG tablet Take 400 mg by mouth daily.   polyethylene glycol (MIRALAX / GLYCOLAX) 17 g packet Take 17 g by mouth daily.   potassium chloride (KLOR-CON M10) 10 MEQ tablet Take 20 mEq by mouth 2 (two) times daily.   predniSONE (DELTASONE) 10 MG tablet Take 6 tablets (60 mg total) by mouth daily with breakfast for 1 day, THEN 5 tablets (50 mg total) daily with breakfast for 1 day, THEN 4 tablets (40 mg total) daily with breakfast for 1 day, THEN 3 tablets (30 mg total) daily with breakfast for 1 day, THEN 2 tablets (20 mg total) daily with breakfast for 1 day, THEN 1 tablet (10 mg total) daily with breakfast for 1 day.   SUMAtriptan (IMITREX) 50 MG tablet Take 50 mg by mouth every 2 (two) hours as needed for migraine. May repeat in 2 hours if headache persists or recurs.   [DISCONTINUED] colchicine 0.6 MG tablet Take 1 tablet (0.6 mg total) by mouth 2 (two) times daily.   [DISCONTINUED] pantoprazole (PROTONIX) 40 MG tablet TAKE 1 TABLET BY MOUTH EVERY DAY       07/27/2022    9:37 AM 05/21/2022   10:44 AM 03/12/2022    2:23 PM  GAD 7 : Generalized Anxiety Score  Nervous, Anxious, on Edge 0 0 0  Control/stop worrying 0 0 0  Worry too much - different things 0 0 0  Trouble relaxing 0 0 0  Restless 0 0 0  Easily annoyed or irritable 0 0 0  Afraid - awful might happen 0 0 0  Total GAD 7 Score 0 0 0  Anxiety Difficulty Not  difficult at all Not difficult at all Not difficult at all       07/27/2022    9:37 AM 05/21/2022   10:48 AM 05/21/2022   10:44 AM  Depression screen PHQ 2/9  Decreased Interest 0 0 0  Down, Depressed, Hopeless 0 0 0  PHQ - 2 Score 0 0 0  Altered sleeping 0 0 0  Tired, decreased energy 0 0 0  Change in appetite 0 0 0  Feeling bad or failure about yourself  0 0 0  Trouble concentrating 0 0 0  Moving slowly or fidgety/restless 0 0 0  Suicidal thoughts 0  0 0  PHQ-9 Score 0 0 0  Difficult doing work/chores Not difficult at all Not difficult at all Not difficult at all    BP Readings from Last 3 Encounters:  07/27/22 114/78  06/07/22 126/60  05/21/22 128/76    Physical Exam Vitals and nursing note reviewed.  Constitutional:      General: She is not in acute distress.    Appearance: She is well-developed.  HENT:     Head: Normocephalic and atraumatic.  Pulmonary:     Effort: Pulmonary effort is normal. No respiratory distress.  Musculoskeletal:     Left foot: Decreased range of motion. Swelling present.     Comments: Right index PIP joint swollen and stiff. Left great toe and MTP joints red, warm and tender  Skin:    General: Skin is warm and dry.     Findings: No rash.  Neurological:     Mental Status: She is alert and oriented to person, place, and time.  Psychiatric:        Mood and Affect: Mood normal.        Behavior: Behavior normal.     Wt Readings from Last 3 Encounters:  07/27/22 221 lb (100.2 kg)  06/07/22 222 lb 9.6 oz (101 kg)  05/21/22 222 lb (100.7 kg)    BP 114/78   Pulse 69   Ht 5\' 6"  (1.676 m)   Wt 221 lb (100.2 kg)   SpO2 97%   BMI 35.67 kg/m   Assessment and Plan:  Problem List Items Addressed This Visit     Gouty arthritis of left great toe - Primary    Recurrent symptoms in left big toe despite 2 rounds of colchicine bid x 5 days. Will prednisone taper. Will start allopurinol after current episode has resolved.      Relevant  Medications   predniSONE (DELTASONE) 10 MG tablet   allopurinol (ZYLOPRIM) 100 MG tablet   colchicine 0.6 MG tablet   Other Visit Diagnoses     Hemorrhoids, unspecified hemorrhoid type       Relevant Medications   hydrocortisone (ANUSOL-HC) 2.5 % rectal cream       No follow-ups on file.   Partially dictated using Dragon software, any errors are not intentional.  Reubin Milan, MD [facility] [place], [place]

## 2022-08-02 ENCOUNTER — Ambulatory Visit: Payer: Self-pay

## 2022-08-02 ENCOUNTER — Other Ambulatory Visit: Payer: Self-pay

## 2022-08-02 DIAGNOSIS — K649 Unspecified hemorrhoids: Secondary | ICD-10-CM

## 2022-08-02 NOTE — Telephone Encounter (Signed)
  Chief Complaint: Hemorrhoid flare Symptoms: Pain - burning Frequency: Before 6/18 Pertinent Negatives: Patient denies  Disposition: [] ED /[] Urgent Care (no appt availability in office) / [] Appointment(In office/virtual)/ []  [facility]/ [] Home Care/ [] Refused Recommended Disposition /[] [place] [facility]/ [x]  Follow-up with PCP Additional Notes: Pt was seen 07/27/2022 for gout and hemorrhoid flare. Pt was prescribed anusol. Pt states that this has not been effective. Pt would like a different medication called in for her hemorrhoid pain. Pt would like a call back when rx is called in.    Summary: Burning. discomfort   The patient called in stating she saw her provider for a hemorrhoid flare up on 06/18 and the hydrocortisone (ANUSOL-HC) 2.5 % rectal cream is not helping. She states she still has a lot of burning and discomfort. Please assist patient further     Reason for Disposition  MODERATE-SEVERE rectal pain (i.e., interferes with school, work, or sleep)  Protocols used: Rectal Symptoms-A-AH

## 2022-08-02 NOTE — Telephone Encounter (Signed)
Patient informed. Per patient should would like a GI doctor through [facility]. Placed referral for [facility] in [place] urgently due to patients pain level,  - Kaegan Hettich

## 2022-08-03 ENCOUNTER — Other Ambulatory Visit: Payer: Self-pay | Admitting: Internal Medicine

## 2022-08-03 DIAGNOSIS — M109 Gout, unspecified: Secondary | ICD-10-CM

## 2022-08-04 ENCOUNTER — Ambulatory Visit: Payer: Self-pay

## 2022-08-04 NOTE — Telephone Encounter (Signed)
Spoke with patient. Patient said she was better with the steriod taper but completed that 2 days ago and symptoms are back. Informed patient per Dr Judithann Graves to try the colchicine BID for the next 2 days and then call Friday if not improvement.  - Lynn Tapia

## 2022-08-04 NOTE — Telephone Encounter (Signed)
Message from Randol Kern sent at 08/04/2022 12:02 PM EDT  Summary: Swelling in feet/Gout Flare up. meds not helping   Pt called reporting that she is having a gout flare up and wants to know if something new can be prescribed. Does not want another appt, just wants symptoms relief. Swelling, warm to the touch, and cannot walk ont it. Current meds not helping  Best contact: [phone]         Chief Complaint: let great toe pain  Symptoms: continued warmth, redness,  Frequency: ongoing for about a month Pertinent Negatives: Patient denies fever Disposition: [] ED /[] Urgent Care (no appt availability in office) / [] Appointment(In office/virtual)/ []  [facility]/ [] Home Care/ [] Refused Recommended Disposition /[] [facility]/ [x]  Follow-up with PCP Additional Notes: pt note a "milky" colored blister (unsure of which day) stated it was whie she was on steroids. Pt wanting to know if she should restart her gout meds. Advised pt will send note to office for PCP to review. No appts available.   Reason for Disposition . Pain in the big toe joint  Answer Assessment - Initial Assessment Questions 1. ONSET: "When did the swelling start?" (e.g., minutes, hours, days)     Ongoing  2. LOCATION: "What part of the leg is swollen?"  "Are both legs swollen or just one leg?"     Left great toe  3. SEVERITY: "How bad is the swelling?" (e.g., localized; mild, moderate, severe)   - Localized: Small area of swelling localized to one leg.   - MILD pedal edema: Swelling limited to foot and ankle, pitting edema < 1/4 inch (6 mm) deep, rest and elevation eliminate most or all swelling.   - MODERATE edema: Swelling of lower leg to knee, pitting edema > 1/4 inch (6 mm) deep, rest and elevation only partially reduce swelling.   - SEVERE edema: Swelling extends above knee, facial or hand swelling present.      Mild  4. REDNESS: "Does the swelling look red or infected?"     Yes  5. PAIN:  "Is the swelling painful to touch?" If Yes, ask: "How painful is it?"   (Scale 1-10; mild, moderate or severe)     Yes  severe with walking constant- limping  6. FEVER: "Do you have a fever?" If Yes, ask: "What is it, how was it measured, and when did it start?"      no 7. CAUSE: "What do you think is causing the leg swelling?"     Gout flare 9. RECURRENT SYMPTOM: "Have you had leg swelling before?" If Yes, ask: "When was the last time?" "What happened that time?"     Yes 07/27/22 10. OTHER SYMPTOMS: "Do you have any other symptoms?" (e.g., chest pain, difficulty breathing)       Swelling. Pain, warn to touch - had a blister with milky white that burst and came off  Answer Assessment - Initial Assessment Questions 1. ONSET: "When did the pain start?"      For a month  2. LOCATION: "Where is the pain located?"      Left great toe 3. PAIN: "How bad is the pain?"    (Scale 1-10; or mild, moderate, severe)  - MILD (1-3): doesn't interfere with normal activities.   - MODERATE (4-7): interferes with normal activities (e.g., work or school) or awakens from sleep, limping.   - SEVERE (8-10): excruciating pain, unable to do any normal activities, unable to walk.  Severe with standing   5. CAUSE: "What do you think is causing the foot pain?"     Gout flare 6. OTHER SYMPTOMS: "Do you have any other symptoms?" (e.g., leg pain, rash, fever, numbness)     Redness, swelling  Protocols used: Foot Pain-A-AH, Leg Swelling and Edema-A-AH

## 2022-08-06 ENCOUNTER — Ambulatory Visit: Payer: Self-pay

## 2022-08-06 NOTE — Telephone Encounter (Signed)
Chief Complaint: Medication and symptoms update Symptoms: Redness, swelling, toe pain that has improved. Frequency: Ongoing  Disposition: [] ED /[] Urgent Care (no appt availability in office) / [] Appointment(In office/virtual)/ []  [place] [facility]/ [] Home Care/ [] Refused Recommended Disposition /[] [place] [facility]/ [x]  Follow-up with PCP Additional Notes: Patient called in to give an update on how she was doing with gout treatment. Patient stated that the she has been taking colchicine BID x2 days as directed along with 600mg  Advil every 8 hours. Patient reports that redness, swelling, and pain is still present but is getting better since starting the Colchicine 2 days ago. Patient would like to know if she should continue taking the Colchicine and for how long. Patient would like to know the next plan of care at this time. Advised patient that a message would be sent and a she will receive an update with provider's recommendations.   Summary: Gout Medication Advise   Pt is calling to report continued concerns with gout. Advised to take colchicine 0.6 MG tablet [[id number]] for 2 days and call back. Pt reports that she is still having concerns with the gout. Please advise     Reason for Disposition  [1] Caller has NON-URGENT medicine question about med that PCP prescribed AND [2] triager unable to answer question  Answer Assessment - Initial Assessment Questions 1. NAME of MEDICINE: "What medicine(s) are you calling about?"     Colchicine  2. QUESTION: "What is your question?" (e.g., double dose of medicine, side effect)     Patient would like to know if she should continue to take medication, it has improved some but it is not 100% better.  3. PRESCRIBER: "Who prescribed the medicine?" Reason: if prescribed by specialist, call should be referred to that group.     Dr. Judithann Graves  4. SYMPTOMS: "Do you have any symptoms?" If Yes, ask: "What symptoms are you having?"  "How bad are the  symptoms (e.g., mild, moderate, severe)     Patient stated it is still difficut to walk, and red with swelling but has improved over the last 3 days.  5. PREGNANCY:  "Is there any chance that you are pregnant?" "When was your last menstrual period?"     N/A  Protocols used: Medication Question Call-A-AH

## 2022-08-06 NOTE — Telephone Encounter (Signed)
Patient informed and verbalized understanding.  - Lynn Tapia 

## 2022-08-27 DIAGNOSIS — H40053 Ocular hypertension, bilateral: Secondary | ICD-10-CM | POA: Diagnosis not present

## 2022-08-27 DIAGNOSIS — H2513 Age-related nuclear cataract, bilateral: Secondary | ICD-10-CM | POA: Diagnosis not present

## 2022-08-27 DIAGNOSIS — H04123 Dry eye syndrome of bilateral lacrimal glands: Secondary | ICD-10-CM | POA: Diagnosis not present

## 2022-08-30 ENCOUNTER — Ambulatory Visit: Payer: Self-pay

## 2022-08-30 ENCOUNTER — Other Ambulatory Visit: Payer: Self-pay | Admitting: Internal Medicine

## 2022-08-30 DIAGNOSIS — M109 Gout, unspecified: Secondary | ICD-10-CM

## 2022-08-30 NOTE — Telephone Encounter (Signed)
Patient informed. Will take colchicine as well to treat the flare for 5 days.

## 2022-08-30 NOTE — Telephone Encounter (Signed)
  Chief Complaint: Gout flare Symptoms: Pain redness great toe Frequency: yesterday Pertinent Negatives: Patient denies  Disposition: [] ED /[] Urgent Care (no appt availability in office) / [] Appointment(In office/virtual)/ []  [place] [facility]/ [] Home Care/ [x] Refused Recommended Disposition /[]  Mobile Bus/ [x]  Follow-up with PCP Additional Notes: Pt states that she started another gout flare yesterday of her big toe. Toe is red and very painful. Pt would like to know if she should continue the allopurinol  through out the flare or should she wait until flare is over. Pt has also taken IBU for pain and inflammation.  Please advise pt. Pt does not want to come into the office and also refused a virtual visit.  Reason for Disposition  [1] MODERATE pain (e.g., interferes with normal activities, limping) AND [2] present > 3 days  Answer Assessment - Initial Assessment Questions 1. ONSET: "When did the pain start?"      Yesterday 2. LOCATION: "Where is the pain located?"      Big toe 3. PAIN: "How bad is the pain?"    (Scale 1-10; or mild, moderate, severe)  - MILD (1-3): doesn't interfere with normal activities.   - MODERATE (4-7): interferes with normal activities (e.g., work or school) or awakens from sleep, limping.   - SEVERE (8-10): excruciating pain, unable to do any normal activities, unable to walk.      Severe  5. CAUSE: "What do you think is causing the foot pain?"     Gout 6. OTHER SYMPTOMS: "Do you have any other symptoms?" (e.g., leg pain, rash, fever, numbness)     redness  Protocols used: Foot Pain-A-AH

## 2022-08-30 NOTE — Telephone Encounter (Unsigned)
Copied from CRM [id number]. Topic: General - Other >> Aug 30, 2022  5:04 PM Everette C wrote: Reason for CRM: Medication Refill - Medication: colchicine 0.6 MG tablet [[id number]]  Has the patient contacted their pharmacy? Yes.   (Agent: If no, request that the patient contact the pharmacy for the refill. If patient does not wish to contact the pharmacy document the reason why and proceed with request.) (Agent: If yes, when and what did the pharmacy advise?)  Preferred Pharmacy (with phone number or street name): [id number] [place], [place] - [address] [place] [address] Phone: [phone] Fax: [phone] Hours: Not open 24 hours   Has the patient been seen for an appointment in the last year OR does the patient have an upcoming appointment? Yes.    Agent: Please be advised that RX refills may take up to 3 business days. We ask that you follow-up with your pharmacy.

## 2022-09-01 MED ORDER — COLCHICINE 0.6 MG PO TABS
0.6000 mg | ORAL_TABLET | Freq: Two times a day (BID) | ORAL | 0 refills | Status: DC
Start: 2022-09-01 — End: 2022-09-09

## 2022-09-01 NOTE — Telephone Encounter (Signed)
Requested medication (s) are due for refill today: routing for review  Requested medication (s) are on the active medication list: yes  Last refill:  07/27/22  Future visit scheduled: yes  Notes to clinic:  Unable to refill per protocol, patient was given a short supply for gout attach,should patient continue to take medication. Routing for approval.      Requested Prescriptions  Pending Prescriptions Disp Refills   colchicine 0.6 MG tablet 30 tablet 0    Sig: Take 1 tablet (0.6 mg total) by mouth 2 (two) times daily.     Endocrinology:  Gout Agents - colchicine Passed - 08/30/2022  5:46 PM      Passed - Cr in normal range and within 360 days    Creatinine, Ser  Date Value Ref Range Status  06/07/2022 0.92 0.57 - 1.00 mg/dL Final         Passed - ALT in normal range and within 360 days    ALT  Date Value Ref Range Status  06/07/2022 29 0 - 32 IU/L Final         Passed - AST in normal range and within 360 days    AST  Date Value Ref Range Status  06/07/2022 26 0 - 40 IU/L Final         Passed - Valid encounter within last 12 months    Recent Outpatient Visits           1 month ago Gouty arthritis of left great toe   [place] [facility] at [facility], Nyoka Cowden, MD   2 months ago Fatigue, unspecified type   [facility] at [facility], Nyoka Cowden, MD   3 months ago Primary hypertension   [place] [facility] at [facility], Nyoka Cowden, MD   3 months ago Gouty arthritis of left great toe   [place] [facility] at [facility], Ocie Bob, MD   5 months ago Hypertensive heart disease without CHF   [facility] at [facility], Nyoka Cowden, MD       Future Appointments             In 1 month Judithann Graves, Nyoka Cowden, MD [facility] at [facility]             Passed - CBC within normal limits and completed in the last 12 months    WBC  Date Value Ref Range Status  05/21/2022 7.7 3.4 - 10.8 x10E3/uL Final   RBC  Date Value Ref Range Status  05/21/2022 4.26 3.77 - 5.28 x10E6/uL Final   Hemoglobin  Date Value Ref Range Status  05/21/2022 13.3 11.1 - 15.9 g/dL Final   Hematocrit  Date Value Ref Range Status  05/21/2022 38.6 34.0 - 46.6 % Final   MCHC  Date Value Ref Range Status  05/21/2022 34.5 31.5 - 35.7 g/dL Final   [facility]  Date Value Ref Range Status  05/21/2022 31.2 26.6 - 33.0 pg Final   MCV  Date Value Ref Range Status  05/21/2022 91 79 - 97 fL Final   No results found for: "PLTCOUNTKUC", "LABPLAT", "POCPLA" RDW  Date Value Ref Range Status  05/21/2022 13.0 11.7 - 15.4 % Final

## 2022-09-09 ENCOUNTER — Other Ambulatory Visit: Payer: Self-pay | Admitting: Internal Medicine

## 2022-09-09 DIAGNOSIS — M109 Gout, unspecified: Secondary | ICD-10-CM

## 2022-09-09 NOTE — Telephone Encounter (Signed)
Requested Prescriptions  Pending Prescriptions Disp Refills   colchicine 0.6 MG tablet [Pharmacy Med Name: COLCHICINE 0.6 MG TABLET] 180 tablet 1    Sig: TAKE 1 TABLET BY MOUTH 2 TIMES DAILY.     Endocrinology:  Gout Agents - colchicine Passed - 09/09/2022  9:33 AM      Passed - Cr in normal range and within 360 days    Creatinine, Ser  Date Value Ref Range Status  06/07/2022 0.92 0.57 - 1.00 mg/dL Final         Passed - ALT in normal range and within 360 days    ALT  Date Value Ref Range Status  06/07/2022 29 0 - 32 IU/L Final         Passed - AST in normal range and within 360 days    AST  Date Value Ref Range Status  06/07/2022 26 0 - 40 IU/L Final         Passed - Valid encounter within last 12 months    Recent Outpatient Visits           1 month ago Gouty arthritis of left great toe   [place] [facility] at [facility], Nyoka Cowden, MD   3 months ago Fatigue, unspecified type   [facility] at [facility], Nyoka Cowden, MD   3 months ago Primary hypertension   [place] [facility] at [facility], Nyoka Cowden, MD   3 months ago Gouty arthritis of left great toe   [place] [facility] at [facility], Ocie Bob, MD   6 months ago Hypertensive heart disease without CHF   [facility] at [facility], Nyoka Cowden, MD       Future Appointments             In 1 month Judithann Graves, Nyoka Cowden, MD [facility] Primary Care & Sports Medicine at [facility]            Passed - CBC within normal limits and completed in the last 12 months    WBC  Date Value Ref Range Status  05/21/2022 7.7 3.4 - 10.8 x10E3/uL Final   RBC  Date Value Ref Range Status  05/21/2022 4.26 3.77 - 5.28 x10E6/uL Final   Hemoglobin  Date Value Ref Range Status  05/21/2022 13.3 11.1 - 15.9 g/dL Final   Hematocrit   Date Value Ref Range Status  05/21/2022 38.6 34.0 - 46.6 % Final   MCHC  Date Value Ref Range Status  05/21/2022 34.5 31.5 - 35.7 g/dL Final   [facility]  Date Value Ref Range Status  05/21/2022 31.2 26.6 - 33.0 pg Final   MCV  Date Value Ref Range Status  05/21/2022 91 79 - 97 fL Final   No results found for: "PLTCOUNTKUC", "LABPLAT", "POCPLA" RDW  Date Value Ref Range Status  05/21/2022 13.0 11.7 - 15.4 % Final

## 2022-09-15 ENCOUNTER — Encounter: Payer: Self-pay | Admitting: Internal Medicine

## 2022-09-15 ENCOUNTER — Ambulatory Visit (INDEPENDENT_AMBULATORY_CARE_PROVIDER_SITE_OTHER): Payer: PPO | Admitting: Internal Medicine

## 2022-09-15 VITALS — BP 118/68 | HR 82 | Temp 98.9°F | Ht 66.0 in | Wt 220.2 lb

## 2022-09-15 DIAGNOSIS — U071 COVID-19: Secondary | ICD-10-CM | POA: Diagnosis not present

## 2022-09-15 DIAGNOSIS — R051 Acute cough: Secondary | ICD-10-CM | POA: Diagnosis not present

## 2022-09-15 LAB — POC COVID19 BINAXNOW: SARS Coronavirus 2 Ag: POSITIVE — AB

## 2022-09-15 MED ORDER — PROMETHAZINE-DM 6.25-15 MG/5ML PO SYRP
5.0000 mL | ORAL_SOLUTION | Freq: Four times a day (QID) | ORAL | 0 refills | Status: AC | PRN
Start: 2022-09-15 — End: 2022-09-24

## 2022-09-15 MED ORDER — AZITHROMYCIN 250 MG PO TABS
ORAL_TABLET | ORAL | 0 refills | Status: AC
Start: 2022-09-15 — End: 2022-09-20

## 2022-09-15 NOTE — Patient Instructions (Addendum)
Take Tylenol 650 - 1000 mg every 6-8 hours for fever, body aches and headache. Drink plenty of fluids with electrolytes. Monitor for fever that does not decrease and/or shortness of breath that worsens or is present at rest. Quarantine for 5 days. Use flonase once a day for congestion and post nasal drip Cough syrup at bedtime.

## 2022-09-15 NOTE — Progress Notes (Signed)
Date:  09/15/2022   Name:  KIERA ERMAN   DOB:  [birth year]   MRN:  [id number]   Chief Complaint: Cough (Cough, sneezing, headache, body aches.)  Cough This is a new problem. The current episode started in the past 7 days. The cough is Productive of sputum. Associated symptoms include headaches, nasal congestion, rhinorrhea and a sore throat. Pertinent negatives include no chest pain, chills, fever, shortness of breath or wheezing.    Lab Results  Component Value Date   NA 139 06/07/2022   K 4.0 06/07/2022   CO2 25 06/07/2022   GLUCOSE 102 (H) 06/07/2022   BUN 16 06/07/2022   CREATININE 0.92 06/07/2022   CALCIUM 9.9 06/07/2022   EGFR 69 06/07/2022   No results found for: "CHOL", "HDL", "LDLCALC", "LDLDIRECT", "TRIG", "CHOLHDL" Lab Results  Component Value Date   TSH 2.270 06/07/2022   Lab Results  Component Value Date   HGBA1C 6.1 (H) 06/07/2022   Lab Results  Component Value Date   WBC 7.7 05/21/2022   HGB 13.3 05/21/2022   HCT 38.6 05/21/2022   MCV 91 05/21/2022   PLT 250 05/21/2022   Lab Results  Component Value Date   ALT 29 06/07/2022   AST 26 06/07/2022   ALKPHOS 58 06/07/2022   BILITOT 0.3 06/07/2022   No results found for: "25OHVITD2", "25OHVITD3", "VD25OH"   Review of Systems  Constitutional:  Positive for fatigue. Negative for chills and fever.  HENT:  Positive for rhinorrhea and sore throat.   Respiratory:  Positive for cough. Negative for chest tightness, shortness of breath and wheezing.   Cardiovascular:  Negative for chest pain and palpitations.  Gastrointestinal:  Positive for diarrhea. Negative for nausea and vomiting.  Neurological:  Positive for headaches. Negative for dizziness.  Psychiatric/Behavioral:  Positive for sleep disturbance. Negative for dysphoric mood. The patient is not nervous/anxious.     Patient Active Problem List   Diagnosis Date Noted   B12 deficiency 06/07/2022   Gouty arthritis of left great toe 05/30/2022    Gouty arthritis 05/21/2022   Gastroesophageal reflux disease 05/21/2022   BMI 35.0-35.9,adult 05/21/2022   Gastritis 05/13/2022   Superior labrum anterior-to-posterior (SLAP) tear of right shoulder 02/11/2022   Cervical spondylosis 05/22/2021   Family history of breast cancer in first degree relative 09/16/2020   Migraine with aura and without status migrainosus, not intractable 09/16/2020   Mixed hyperlipidemia 03/09/2018   Asymptomatic microscopic hematuria 04/29/2016   Chronic idiopathic constipation 04/20/2016   Internal hemorrhoids with complication 09/17/2015   Rectocele 09/17/2015   History of adenomatous polyp of colon 03/06/2015   Hypertensive heart disease without CHF 11/28/2014   Primary hypertension 11/26/2012    Allergies  Allergen Reactions   Penicillins     Reports this happened at 17 months old    Sulfa Antibiotics Rash   Telmisartan Other (See Comments)    Patient reports nightmares     Past Surgical History:  Procedure Laterality Date   ABDOMINAL HYSTERECTOMY  03/2008   ANUS SURGERY  12/2018   CHOLECYSTECTOMY  2002   at [facility] ARTHROSCOPY WITH SUBACROMIAL DECOMPRESSION, ROTATOR CUFF REPAIR AND BICEP TENDON REPAIR Right 02/11/2022   Procedure: SHOULDER ARTHROSCOPY WITH DEBRIDEMENT, DECOMPRESSION, ROTATOR CUFF REPAIR AND BIPEPS TENODESIS.;  Surgeon: Christena Flake, MD;  Location: [facility] ORS;  Service: Orthopedics;  Laterality: Right;    Social History   Tobacco Use   Smoking status: Never   Smokeless tobacco: Never  Vaping Use  Vaping status: Never Used  Substance Use Topics   Alcohol use: Never   Drug use: Never     Medication list has been reviewed and updated.  Current Meds  Medication Sig   azithromycin (ZITHROMAX Z-PAK) 250 MG tablet UAD   promethazine-dextromethorphan (PROMETHAZINE-DM) 6.25-15 MG/5ML syrup Take 5 mLs by mouth 4 (four) times daily as needed for up to 9 days for cough.       09/15/2022   11:21 AM 07/27/2022    9:37 AM  05/21/2022   10:44 AM 03/12/2022    2:23 PM  GAD 7 : Generalized Anxiety Score  Nervous, Anxious, on Edge 0 0 0 0  Control/stop worrying 0 0 0 0  Worry too much - different things 0 0 0 0  Trouble relaxing 0 0 0 0  Restless 0 0 0 0  Easily annoyed or irritable 0 0 0 0  Afraid - awful might happen 0 0 0 0  Total GAD 7 Score 0 0 0 0  Anxiety Difficulty Not difficult at all Not difficult at all Not difficult at all Not difficult at all       09/15/2022   11:21 AM 07/27/2022    9:37 AM 05/21/2022   10:48 AM  Depression screen PHQ 2/9  Decreased Interest 0 0 0  Down, Depressed, Hopeless 0 0 0  PHQ - 2 Score 0 0 0  Altered sleeping 0 0 0  Tired, decreased energy 0 0 0  Change in appetite 0 0 0  Feeling bad or failure about yourself  0 0 0  Trouble concentrating 0 0 0  Moving slowly or fidgety/restless 0 0 0  Suicidal thoughts 0 0 0  PHQ-9 Score 0 0 0  Difficult doing work/chores Not difficult at all Not difficult at all Not difficult at all    BP Readings from Last 3 Encounters:  09/15/22 118/68  07/27/22 114/78  06/07/22 126/60    Physical Exam Constitutional:      Appearance: Normal appearance.  Cardiovascular:     Rate and Rhythm: Normal rate and regular rhythm.  Pulmonary:     Effort: Pulmonary effort is normal.     Breath sounds: No wheezing or rhonchi.     Comments: Frequent loose cough Musculoskeletal:     Cervical back: Normal range of motion.     Right lower leg: No edema.     Left lower leg: No edema.  Lymphadenopathy:     Cervical: No cervical adenopathy.  Neurological:     General: No focal deficit present.     Mental Status: She is alert.     Wt Readings from Last 3 Encounters:  09/15/22 220 lb 3.2 oz (99.9 kg)  07/27/22 221 lb (100.2 kg)  06/07/22 222 lb 9.6 oz (101 kg)    BP 118/68   Pulse 82   Temp 98.9 F (37.2 C) (Oral)   Ht 5\' 6"  (1.676 m)   Wt 220 lb 3.2 oz (99.9 kg)   SpO2 95%   BMI 35.54 kg/m   Assessment and Plan:  Problem List  Items Addressed This Visit   None Visit Diagnoses     Acute cough    -  Primary   secondary to Covid infection cough more productive so will cover with antibiotics   Relevant Medications   promethazine-dextromethorphan (PROMETHAZINE-DM) 6.25-15 MG/5ML syrup   azithromycin (ZITHROMAX Z-PAK) 250 MG tablet   Other Relevant Orders   POC COVID-19 BinaxNow (Completed)   COVID-19 virus infection  onset 2 days ago home care advise given follow up if no improvement   Relevant Medications   azithromycin (ZITHROMAX Z-PAK) 250 MG tablet       No follow-ups on file.    Reubin Milan, MD [facility] [place]

## 2022-09-23 DIAGNOSIS — R238 Other skin changes: Secondary | ICD-10-CM | POA: Diagnosis not present

## 2022-09-23 DIAGNOSIS — L821 Other seborrheic keratosis: Secondary | ICD-10-CM | POA: Diagnosis not present

## 2022-09-23 DIAGNOSIS — L280 Lichen simplex chronicus: Secondary | ICD-10-CM | POA: Diagnosis not present

## 2022-09-23 DIAGNOSIS — L820 Inflamed seborrheic keratosis: Secondary | ICD-10-CM | POA: Diagnosis not present

## 2022-09-23 DIAGNOSIS — B078 Other viral warts: Secondary | ICD-10-CM | POA: Diagnosis not present

## 2022-09-23 DIAGNOSIS — R208 Other disturbances of skin sensation: Secondary | ICD-10-CM | POA: Diagnosis not present

## 2022-09-23 DIAGNOSIS — L538 Other specified erythematous conditions: Secondary | ICD-10-CM | POA: Diagnosis not present

## 2022-09-23 DIAGNOSIS — R202 Paresthesia of skin: Secondary | ICD-10-CM | POA: Diagnosis not present

## 2022-09-23 DIAGNOSIS — D485 Neoplasm of uncertain behavior of skin: Secondary | ICD-10-CM | POA: Diagnosis not present

## 2022-10-07 ENCOUNTER — Other Ambulatory Visit: Payer: Self-pay | Admitting: Internal Medicine

## 2022-10-07 NOTE — Telephone Encounter (Signed)
Medication Refill - Medication: sumatriptan succ 50 mg   as needed  Has the patient contacted their pharmacy? Yes.   (Agent: If no, request that the patient contact the pharmacy for the refill. If patient does not wish to contact the pharmacy document the reason why and proceed with request.) (Agent: If yes, when and what did the pharmacy advise?)  Preferred Pharmacy (with phone number or street name): [facility] [place] Has the patient been seen for an appointment in the last year OR does the patient have an upcoming appointment? Yes.    Agent: Please be advised that RX refills may take up to 3 business days. We ask that you follow-up with your pharmacy.

## 2022-10-08 MED ORDER — SUMATRIPTAN SUCCINATE 50 MG PO TABS: 50.0000 mg | ORAL_TABLET | ORAL | 3 refills | Status: AC | PRN

## 2022-10-08 NOTE — Telephone Encounter (Signed)
Please review.  KP

## 2022-10-08 NOTE — Telephone Encounter (Signed)
Requested medication (s) are due for refill today:   Requested medication (s) are on the active medication list: Yes  Last refill:    Future visit scheduled: Yes  Notes to clinic:  Historical provider.    Requested Prescriptions  Pending Prescriptions Disp Refills   SUMAtriptan (IMITREX) 50 MG tablet 10 tablet     Sig: Take 1 tablet (50 mg total) by mouth every 2 (two) hours as needed for migraine. May repeat in 2 hours if headache persists or recurs.     Neurology:  Migraine Therapy - Triptan Passed - 10/08/2022  9:40 AM      Passed - Last BP in normal range    BP Readings from Last 1 Encounters:  09/15/22 118/68         Passed - Valid encounter within last 12 months    Recent Outpatient Visits           3 weeks ago Acute cough   [place] [facility] at [facility], Nyoka Cowden, MD   2 months ago Gouty arthritis of left great toe   [place] [facility] at [facility], Nyoka Cowden, MD   4 months ago Fatigue, unspecified type   [facility] Medicine at [facility], Nyoka Cowden, MD   4 months ago Primary hypertension   [place] [facility] at [facility], Nyoka Cowden, MD   4 months ago Gouty arthritis of left great toe   [facility] at [facility], Ocie Bob, MD       Future Appointments             In 2 weeks Judithann Graves Nyoka Cowden, MD [facility] Primary Care & Sports Medicine at [facility]

## 2022-10-12 ENCOUNTER — Ambulatory Visit: Payer: Self-pay

## 2022-10-12 NOTE — Telephone Encounter (Signed)
Summary: gout flare up,swelling and hot.   Pt stated is having a gout flare up.Is asking if she should have her medication changed? She is unsure what to do. Stated is having swelling and are is hot. Asked if she needs to increase her dose of medication and/or if something else else can be called in?   Seeking clinical advice.     Chief Complaint: Left Great toe pain, redness swelling. Has been taking Allopurinol. Started colchicine yesterday. Drinking cherry juice. Declines OV. Asking for additional medication. Symptoms: Above Frequency: Yesterday Pertinent Negatives: Patient denies fever Disposition: [] ED /[] Urgent Care (no appt availability in office) / [] Appointment(In office/virtual)/ []  [place] [facility]/ [] Home Care/ [x] Refused Recommended Disposition /[] [place] [facility]/ []  Follow-up with PCP Additional Notes: Please advise pt.  Reason for Disposition  [1] Redness of the skin AND [2] no fever  Answer Assessment - Initial Assessment Questions 1. ONSET: "When did the pain start?"      Yesterday 2. LOCATION: "Where is the pain located?"      Left toe 3. PAIN: "How bad is the pain?"    (Scale 1-10; or mild, moderate, severe)  - MILD (1-3): doesn't interfere with normal activities.   - MODERATE (4-7): interferes with normal activities (e.g., work or school) or awakens from sleep, limping.   - SEVERE (8-10): excruciating pain, unable to do any normal activities, unable to walk.      7 4. WORK OR EXERCISE: "Has there been any recent work or exercise that involved this part of the body?"      No 5. CAUSE: "What do you think is causing the foot pain?"     Gout 6. OTHER SYMPTOMS: "Do you have any other symptoms?" (e.g., leg pain, rash, fever, numbness)     Red, swollen 7. PREGNANCY: "Is there any chance you are pregnant?" "When was your last menstrual period?"     No  Protocols used: Foot Pain-A-AH

## 2022-10-12 NOTE — Telephone Encounter (Signed)
Pt needs an appointment same day is ok.  KP

## 2022-10-13 ENCOUNTER — Other Ambulatory Visit
Admission: RE | Admit: 2022-10-13 | Discharge: 2022-10-13 | Disposition: A | Discharge status: Home / Self Care | Payer: PPO | Attending: Internal Medicine | Admitting: Internal Medicine

## 2022-10-13 ENCOUNTER — Ambulatory Visit: Payer: Self-pay | Admitting: *Deleted

## 2022-10-13 ENCOUNTER — Ambulatory Visit: Payer: PPO | Admitting: Internal Medicine

## 2022-10-13 ENCOUNTER — Ambulatory Visit (INDEPENDENT_AMBULATORY_CARE_PROVIDER_SITE_OTHER): Payer: PPO | Admitting: Internal Medicine

## 2022-10-13 ENCOUNTER — Encounter: Payer: Self-pay | Admitting: Internal Medicine

## 2022-10-13 VITALS — BP 122/78 | HR 79 | Ht 66.0 in | Wt 224.0 lb

## 2022-10-13 DIAGNOSIS — M109 Gout, unspecified: Secondary | ICD-10-CM | POA: Insufficient documentation

## 2022-10-13 LAB — BASIC METABOLIC PANEL
Anion gap: 12 (ref 5–15)
BUN: 21 mg/dL (ref 8–23)
CO2: 29 mmol/L (ref 22–32)
Calcium: 8.8 mg/dL — ABNORMAL LOW (ref 8.9–10.3)
Chloride: 96 mmol/L — ABNORMAL LOW (ref 98–111)
Creatinine, Ser: 0.92 mg/dL (ref 0.44–1.00)
GFR, Estimated: >60 mL/min (ref >60)
Glucose, Bld: 138 mg/dL — ABNORMAL HIGH (ref 70–99)
Potassium: 3.4 mmol/L — ABNORMAL LOW (ref 3.5–5.1)
Sodium: 137 mmol/L (ref 135–145)

## 2022-10-13 LAB — URIC ACID: Uric Acid, Serum: 5.8 mg/dL (ref 2.5–7.1)

## 2022-10-13 MED ORDER — PREDNISONE 10 MG PO TABS
ORAL_TABLET | ORAL | 0 refills | Status: AC
Start: 2022-10-13 — End: 2022-10-18

## 2022-10-13 NOTE — Telephone Encounter (Signed)
  Chief Complaint: Results Symptoms: Na Frequency: NA Pertinent Negatives: Patient denies NA Disposition: [] ED /[] Urgent Care (no appt availability in office) / [] Appointment(In office/virtual)/ []  [place] [facility]/ [] Home Care/ [] Refused Recommended Disposition /[] [place] Mobile [facility]/ [x]  Follow-up with PCP Additional Notes:  Result note read to pt, verbalizes understanding.  Pt questioning if she should continue Colchicine. States symptoms are "Still  a little bit." Did advise order 2x daily. Assured pt NT would route to PCP for review.  Please advise.

## 2022-10-13 NOTE — Assessment & Plan Note (Addendum)
Recurrent flares - last in June at which time she was started on allopurinol 100 mg daily. Continue colchicine bid for 3 more days Rx for steroid taper to use if needed since she is going on vacation in three days Will get labs and adjust allopurinol dose.

## 2022-10-13 NOTE — Progress Notes (Signed)
Date:  10/13/2022   Name:  Lynn Tapia   DOB:  [birth year]   MRN:  [id number]   Chief Complaint: Gout (Left Great Toe. Patient had this in Left Toe in June. Painful. Taking allopurinol and colchicine. )  HPI Gout - last episode in June, recent worsening of the left great toe.  Lab Results  Component Value Date   NA 139 06/07/2022   K 4.0 06/07/2022   CO2 25 06/07/2022   GLUCOSE 102 (H) 06/07/2022   BUN 16 06/07/2022   CREATININE 0.92 06/07/2022   CALCIUM 9.9 06/07/2022   EGFR 69 06/07/2022   No results found for: "CHOL", "HDL", "LDLCALC", "LDLDIRECT", "TRIG", "CHOLHDL" Lab Results  Component Value Date   TSH 2.270 06/07/2022   Lab Results  Component Value Date   HGBA1C 6.1 (H) 06/07/2022   Lab Results  Component Value Date   WBC 7.7 05/21/2022   HGB 13.3 05/21/2022   HCT 38.6 05/21/2022   MCV 91 05/21/2022   PLT 250 05/21/2022   Lab Results  Component Value Date   ALT 29 06/07/2022   AST 26 06/07/2022   ALKPHOS 58 06/07/2022   BILITOT 0.3 06/07/2022   No results found for: "25OHVITD2", "25OHVITD3", "VD25OH"   Review of Systems  Constitutional:  Negative for chills, fatigue and fever.  Respiratory:  Negative for chest tightness and shortness of breath.   Genitourinary:  Negative for difficulty urinating.  Musculoskeletal:  Positive for arthralgias, gait problem, joint swelling and myalgias.    Patient Active Problem List   Diagnosis Date Noted   B12 deficiency 06/07/2022   Gouty arthritis of left great toe 05/30/2022   Gouty arthritis 05/21/2022   Gastroesophageal reflux disease 05/21/2022   BMI 35.0-35.9,adult 05/21/2022   Gastritis 05/13/2022   Superior labrum anterior-to-posterior (SLAP) tear of right shoulder 02/11/2022   Cervical spondylosis 05/22/2021   Family history of breast cancer in first degree relative 09/16/2020   Migraine with aura and without status migrainosus, not intractable 09/16/2020   Mixed hyperlipidemia 03/09/2018    Asymptomatic microscopic hematuria 04/29/2016   Chronic idiopathic constipation 04/20/2016   Internal hemorrhoids with complication 09/17/2015   Rectocele 09/17/2015   History of adenomatous polyp of colon 03/06/2015   Hypertensive heart disease without CHF 11/28/2014   Primary hypertension 11/26/2012    Allergies  Allergen Reactions   Penicillins     Reports this happened at 4 months old    Sulfa Antibiotics Rash   Telmisartan Other (See Comments)    Patient reports nightmares     Past Surgical History:  Procedure Laterality Date   ABDOMINAL HYSTERECTOMY  03/2008   ANUS SURGERY  12/2018   CHOLECYSTECTOMY  2002   at [facility] ARTHROSCOPY WITH SUBACROMIAL DECOMPRESSION, ROTATOR CUFF REPAIR AND BICEP TENDON REPAIR Right 02/11/2022   Procedure: SHOULDER ARTHROSCOPY WITH DEBRIDEMENT, DECOMPRESSION, ROTATOR CUFF REPAIR AND BIPEPS TENODESIS.;  Surgeon: Christena Flake, MD;  Location: [facility] ORS;  Service: Orthopedics;  Laterality: Right;    Social History   Tobacco Use   Smoking status: Never   Smokeless tobacco: Never  Vaping Use   Vaping status: Never Used  Substance Use Topics   Alcohol use: Never   Drug use: Never     Medication list has been reviewed and updated.  Current Meds  Medication Sig   acyclovir ointment (ZOVIRAX) 5 % Apply 1 Application topically every 3 (three) hours.   allopurinol (ZYLOPRIM) 100 MG tablet Take 1 tablet (100 mg total)  by mouth daily.   ascorbic acid (VITAMIN C) 1000 MG tablet Take 1,000 mg by mouth daily.   chlorthalidone (HYGROTON) 25 MG tablet Take 25 mg by mouth daily.   cholecalciferol (VITAMIN D3) 25 MCG (1000 UNIT) tablet Take 1,000 Units by mouth daily.   colchicine 0.6 MG tablet TAKE 1 TABLET BY MOUTH 2 TIMES DAILY.   cyanocobalamin (VITAMIN B12) 1000 MCG tablet Take 1,000 mcg by mouth daily.   docusate sodium (COLACE) 100 MG capsule Take 100 mg by mouth 2 (two) times daily.   fluocinonide cream (LIDEX) 0.05 % Apply 1 Application  topically 2 (two) times daily.   fluticasone (FLONASE) 50 MCG/ACT nasal spray INSTILL 2 SPRAYS BY EACH NARE ROUTE DAILY.   hydrocortisone (ANUSOL-HC) 2.5 % rectal cream Place 1 Application rectally 2 (two) times daily.   losartan (COZAAR) 25 MG tablet Take 25 mg by mouth daily.   magnesium oxide (MAG-OX) 400 (240 Mg) MG tablet Take 400 mg by mouth daily.   polyethylene glycol (MIRALAX / GLYCOLAX) 17 g packet Take 17 g by mouth daily.   potassium chloride (KLOR-CON M10) 10 MEQ tablet Take 20 mEq by mouth 2 (two) times daily.   predniSONE (DELTASONE) 10 MG tablet Take 6 tablets (60 mg total) by mouth daily with breakfast for 1 day, THEN 5 tablets (50 mg total) daily with breakfast for 1 day, THEN 4 tablets (40 mg total) daily with breakfast for 1 day, THEN 3 tablets (30 mg total) daily with breakfast for 1 day, THEN 2 tablets (20 mg total) daily with breakfast for 1 day, THEN 1 tablet (10 mg total) daily with breakfast for 1 day.   SUMAtriptan (IMITREX) 50 MG tablet Take 1 tablet (50 mg total) by mouth every 2 (two) hours as needed for migraine. May repeat in 2 hours if headache persists or recurs.       10/13/2022    1:42 PM 09/15/2022   11:21 AM 07/27/2022    9:37 AM 05/21/2022   10:44 AM  GAD 7 : Generalized Anxiety Score  Nervous, Anxious, on Edge 0 0 0 0  Control/stop worrying 0 0 0 0  Worry too much - different things 0 0 0 0  Trouble relaxing 0 0 0 0  Restless 0 0 0 0  Easily annoyed or irritable 0 0 0 0  Afraid - awful might happen 0 0 0 0  Total GAD 7 Score 0 0 0 0  Anxiety Difficulty Not difficult at all Not difficult at all Not difficult at all Not difficult at all       10/13/2022    1:42 PM 09/15/2022   11:21 AM 07/27/2022    9:37 AM  Depression screen PHQ 2/9  Decreased Interest 0 0 0  Down, Depressed, Hopeless 0 0 0  PHQ - 2 Score 0 0 0  Altered sleeping 0 0 0  Tired, decreased energy 0 0 0  Change in appetite 0 0 0  Feeling bad or failure about yourself  0 0 0  Trouble  concentrating 0 0 0  Moving slowly or fidgety/restless 0 0 0  Suicidal thoughts 0 0 0  PHQ-9 Score 0 0 0  Difficult doing work/chores Not difficult at all Not difficult at all Not difficult at all    BP Readings from Last 3 Encounters:  10/13/22 122/78  09/15/22 118/68  07/27/22 114/78    Physical Exam Vitals and nursing note reviewed.  Constitutional:      General: She is  not in acute distress.    Appearance: She is well-developed.  HENT:     Head: Normocephalic and atraumatic.  Pulmonary:     Effort: Pulmonary effort is normal. No respiratory distress.  Musculoskeletal:     Left foot: Decreased range of motion. Swelling and bony tenderness present.     Comments: Over first MTP joint  Skin:    General: Skin is warm and dry.     Findings: No rash.  Neurological:     Mental Status: She is alert and oriented to person, place, and time.  Psychiatric:        Mood and Affect: Mood normal.        Behavior: Behavior normal.     Wt Readings from Last 3 Encounters:  10/13/22 224 lb (101.6 kg)  09/15/22 220 lb 3.2 oz (99.9 kg)  07/27/22 221 lb (100.2 kg)    BP 122/78   Pulse 79   Ht 5\' 6"  (1.676 m)   Wt 224 lb (101.6 kg)   SpO2 97%   BMI 36.15 kg/m   Assessment and Plan:  Problem List Items Addressed This Visit       Unprioritized   Gouty arthritis - Primary    Recurrent flares - last in June at which time she was started on allopurinol 100 mg daily. Continue colchicine bid for 3 more days Rx for steroid taper to use if needed since she is going on vacation in three days Will get labs and adjust allopurinol dose.      Relevant Medications   predniSONE (DELTASONE) 10 MG tablet   Other Relevant Orders   Basic metabolic panel   Uric acid    No follow-ups on file.    Reubin Milan, MD [facility] [place]

## 2022-10-27 ENCOUNTER — Ambulatory Visit (INDEPENDENT_AMBULATORY_CARE_PROVIDER_SITE_OTHER): Payer: PPO | Admitting: Internal Medicine

## 2022-10-27 ENCOUNTER — Encounter: Payer: Self-pay | Admitting: Internal Medicine

## 2022-10-27 VITALS — BP 124/78 | HR 68 | Ht 66.0 in | Wt 223.2 lb

## 2022-10-27 DIAGNOSIS — Z0000 Encounter for general adult medical examination without abnormal findings: Secondary | ICD-10-CM

## 2022-10-27 DIAGNOSIS — Z1159 Encounter for screening for other viral diseases: Secondary | ICD-10-CM

## 2022-10-27 DIAGNOSIS — R7303 Prediabetes: Secondary | ICD-10-CM

## 2022-10-27 DIAGNOSIS — I10 Essential (primary) hypertension: Secondary | ICD-10-CM

## 2022-10-27 DIAGNOSIS — Z23 Encounter for immunization: Secondary | ICD-10-CM

## 2022-10-27 DIAGNOSIS — Z1231 Encounter for screening mammogram for malignant neoplasm of breast: Secondary | ICD-10-CM | POA: Diagnosis not present

## 2022-10-27 DIAGNOSIS — E782 Mixed hyperlipidemia: Secondary | ICD-10-CM

## 2022-10-27 DIAGNOSIS — M778 Other enthesopathies, not elsewhere classified: Secondary | ICD-10-CM

## 2022-10-27 DIAGNOSIS — M109 Gout, unspecified: Secondary | ICD-10-CM | POA: Diagnosis not present

## 2022-10-27 DIAGNOSIS — G43109 Migraine with aura, not intractable, without status migrainosus: Secondary | ICD-10-CM | POA: Diagnosis not present

## 2022-10-27 DIAGNOSIS — Z13820 Encounter for screening for osteoporosis: Secondary | ICD-10-CM

## 2022-10-27 LAB — POCT URINALYSIS DIPSTICK
Bilirubin, UA: NEGATIVE
Blood, UA: NEGATIVE
Glucose, UA: NEGATIVE
Ketones, UA: NEGATIVE
Leukocytes, UA: NEGATIVE
Nitrite, UA: NEGATIVE
Protein, UA: NEGATIVE
Spec Grav, UA: 1.015 (ref 1.010–1.025)
Urobilinogen, UA: 0.2 U/dL
pH, UA: 7 (ref 5.0–8.0)

## 2022-10-27 MED ORDER — LOSARTAN POTASSIUM 25 MG PO TABS
25.0000 mg | ORAL_TABLET | Freq: Every day | ORAL | 1 refills | Status: DC
Start: 2022-10-27 — End: 2023-04-27

## 2022-10-27 NOTE — Assessment & Plan Note (Signed)
No recent change in the character or frequency of headaches. Headaches respond well to current therapy with Imitrex. Will continue current plan and follow up if worsening.

## 2022-10-27 NOTE — Assessment & Plan Note (Signed)
Managed with diet and lifestyle. Lab Results  Component Value Date   HGBA1C 6.1 (H) 06/07/2022  Continue current management

## 2022-10-27 NOTE — Assessment & Plan Note (Addendum)
LDL is 138. Currently being treated with lifestyle changes with good compliance.

## 2022-10-27 NOTE — Assessment & Plan Note (Addendum)
Normal exam with stable BP on losartan and hctz. Having ongoing leg cramps despite magnesium and potassium supplements Check labs, consider lowering dose of chlorthalidone or increasing dose of K+ or Mg++

## 2022-10-27 NOTE — Patient Instructions (Addendum)
Call [facility] to schedule your bone density at [phone].  Use Voltaren gel on left wrist qid - can splint if helpful.

## 2022-10-27 NOTE — Progress Notes (Signed)
Date:  10/27/2022   Name:  Lynn Tapia   DOB:  [birth year]   MRN:  [id number]   Chief Complaint: Annual Exam Lynn Tapia is a 67 y.o. female who presents today for her Complete Annual Exam. She feels well. She reports exercising - none. She reports she is sleeping well. Breast complaints - none.  Mammogram: scheduled DEXA: none Colonoscopy: 01/2021 repeat 10 yrs  Health Maintenance Due  Topic Date Due   HIV Screening  Never done   Hepatitis C Screening  Never done   DEXA SCAN  Never done   COVID-19 Vaccine (8 - 2023-24 season) 10/10/2022    Immunization History  Administered Date(s) Administered   COVID-19, mRNA, vaccine(Comirnaty)12 years and older 11/26/2021   Fluad Trivalent(High Dose 65+) 10/27/2022   Influenza Inj Mdck Quad Pf 11/03/2017, 11/11/2020, 10/19/2021   Influenza Nasal 10/26/2012   Influenza,inj,quad, With Preservative 11/21/2014   Influenza-Unspecified 10/25/2015, 11/02/2016, 11/03/2017, 11/04/2018   PFIZER Comirnaty(Gray Top)Covid-19 Tri-Sucrose Vaccine 02/22/2019, 03/15/2019, 06/02/2020   PFIZER(Purple Top)SARS-COV-2 Vaccination 02/22/2019, 03/15/2019, 11/06/2019   PNEUMOCOCCAL CONJUGATE-20 05/21/2022   Pneumococcal Polysaccharide-23 02/09/2008   Respiratory Syncytial Virus Vaccine,Recomb Aduvanted(Arexvy) 12/18/2021   Tdap 12/07/2012, 11/26/2021   Zoster Recombinant(Shingrix) 08/24/2016, 01/20/2017    Hypertension This is a chronic problem. The problem is controlled. Pertinent negatives include no chest pain, headaches, palpitations or shortness of breath. Past treatments include angiotensin blockers and diuretics.  Hyperlipidemia This is a chronic problem. Associated symptoms include myalgias (leg and foot cramps at night). Pertinent negatives include no chest pain or shortness of breath. Current antihyperlipidemic treatment includes diet change.    Lab Results  Component Value Date   NA 137 10/13/2022   K 3.4 (L) 10/13/2022   CO2 29  10/13/2022   GLUCOSE 138 (H) 10/13/2022   BUN 21 10/13/2022   CREATININE 0.92 10/13/2022   CALCIUM 8.8 (L) 10/13/2022   EGFR 69 06/07/2022   GFRNONAA >60 10/13/2022   No results found for: "CHOL", "HDL", "LDLCALC", "LDLDIRECT", "TRIG", "CHOLHDL" Lab Results  Component Value Date   TSH 2.270 06/07/2022   Lab Results  Component Value Date   HGBA1C 6.1 (H) 06/07/2022   Lab Results  Component Value Date   WBC 7.7 05/21/2022   HGB 13.3 05/21/2022   HCT 38.6 05/21/2022   MCV 91 05/21/2022   PLT 250 05/21/2022   Lab Results  Component Value Date   ALT 29 06/07/2022   AST 26 06/07/2022   ALKPHOS 58 06/07/2022   BILITOT 0.3 06/07/2022   Lab Results  Component Value Date   VITAMINB12 1,292 (H) 06/07/2022    No results found for: "25OHVITD2", "25OHVITD3", "VD25OH"   Review of Systems  Constitutional:  Negative for chills, fatigue and fever.  HENT:  Negative for congestion, hearing loss, tinnitus, trouble swallowing and voice change.   Eyes:  Negative for visual disturbance.  Respiratory:  Negative for cough, chest tightness, shortness of breath and wheezing.   Cardiovascular:  Negative for chest pain, palpitations and leg swelling.  Gastrointestinal:  Negative for abdominal pain, constipation, diarrhea and vomiting.  Endocrine: Negative for polydipsia and polyuria.  Genitourinary:  Negative for dysuria, frequency, genital sores, vaginal bleeding and vaginal discharge.  Musculoskeletal:  Positive for arthralgias (left wrist tendonitis) and myalgias (leg and foot cramps at night). Negative for gait problem and joint swelling.  Skin:  Negative for color change and rash.  Neurological:  Negative for dizziness, tremors, light-headedness and headaches.  Hematological:  Negative for adenopathy. Does not  bruise/bleed easily.  Psychiatric/Behavioral:  Negative for dysphoric mood and sleep disturbance. The patient is not nervous/anxious.     Patient Active Problem List   Diagnosis  Date Noted   Prediabetes 10/27/2022   B12 deficiency 06/07/2022   Gouty arthritis of left great toe 05/30/2022   Gouty arthritis 05/21/2022   Gastroesophageal reflux disease 05/21/2022   BMI 35.0-35.9,adult 05/21/2022   Gastritis 05/13/2022   Superior labrum anterior-to-posterior (SLAP) tear of right shoulder 02/11/2022   Cervical spondylosis 05/22/2021   Family history of breast cancer in first degree relative 09/16/2020   Migraine with aura and without status migrainosus, not intractable 09/16/2020   Mixed hyperlipidemia 03/09/2018   Asymptomatic microscopic hematuria 04/29/2016   Chronic idiopathic constipation 04/20/2016   Internal hemorrhoids with complication 09/17/2015   Rectocele 09/17/2015   History of adenomatous polyp of colon 03/06/2015   Hypertensive heart disease without CHF 11/28/2014   Primary hypertension 11/26/2012    Allergies  Allergen Reactions   Penicillins     Reports this happened at 72 months old    Sulfa Antibiotics Rash   Telmisartan Other (See Comments)    Patient reports nightmares     Past Surgical History:  Procedure Laterality Date   ABDOMINAL HYSTERECTOMY  03/2008   ANUS SURGERY  12/2018   CHOLECYSTECTOMY  2002   at [facility] ARTHROSCOPY WITH SUBACROMIAL DECOMPRESSION, ROTATOR CUFF REPAIR AND BICEP TENDON REPAIR Right 02/11/2022   Procedure: SHOULDER ARTHROSCOPY WITH DEBRIDEMENT, DECOMPRESSION, ROTATOR CUFF REPAIR AND BIPEPS TENODESIS.;  Surgeon: Christena Flake, MD;  Location: [facility] ORS;  Service: Orthopedics;  Laterality: Right;    Social History   Tobacco Use   Smoking status: Never   Smokeless tobacco: Never  Vaping Use   Vaping status: Never Used  Substance Use Topics   Alcohol use: Never   Drug use: Never     Medication list has been reviewed and updated.  Current Meds  Medication Sig   acyclovir ointment (ZOVIRAX) 5 % Apply 1 Application topically every 3 (three) hours.   allopurinol (ZYLOPRIM) 100 MG tablet Take 1  tablet (100 mg total) by mouth daily.   ascorbic acid (VITAMIN C) 1000 MG tablet Take 1,000 mg by mouth daily.   chlorthalidone (HYGROTON) 25 MG tablet Take 25 mg by mouth daily.   cholecalciferol (VITAMIN D3) 25 MCG (1000 UNIT) tablet Take 1,000 Units by mouth daily.   colchicine 0.6 MG tablet TAKE 1 TABLET BY MOUTH 2 TIMES DAILY.   cyanocobalamin (VITAMIN B12) 1000 MCG tablet Take 1,000 mcg by mouth daily.   docusate sodium (COLACE) 100 MG capsule Take 100 mg by mouth 2 (two) times daily.   fluocinonide cream (LIDEX) 0.05 % Apply 1 Application topically 2 (two) times daily.   fluticasone (FLONASE) 50 MCG/ACT nasal spray INSTILL 2 SPRAYS BY EACH NARE ROUTE DAILY.   hydrocortisone (ANUSOL-HC) 2.5 % rectal cream Place 1 Application rectally 2 (two) times daily.   magnesium oxide (MAG-OX) 400 (240 Mg) MG tablet Take 400 mg by mouth daily.   polyethylene glycol (MIRALAX / GLYCOLAX) 17 g packet Take 17 g by mouth daily.   potassium chloride (KLOR-CON M10) 10 MEQ tablet Take 20 mEq by mouth daily.   SUMAtriptan (IMITREX) 50 MG tablet Take 1 tablet (50 mg total) by mouth every 2 (two) hours as needed for migraine. May repeat in 2 hours if headache persists or recurs.   [DISCONTINUED] losartan (COZAAR) 25 MG tablet Take 25 mg by mouth daily.  10/27/2022   10:11 AM 10/13/2022    1:42 PM 09/15/2022   11:21 AM 07/27/2022    9:37 AM  GAD 7 : Generalized Anxiety Score  Nervous, Anxious, on Edge 0 0 0 0  Control/stop worrying 0 0 0 0  Worry too much - different things 0 0 0 0  Trouble relaxing 0 0 0 0  Restless 0 0 0 0  Easily annoyed or irritable 0 0 0 0  Afraid - awful might happen 0 0 0 0  Total GAD 7 Score 0 0 0 0  Anxiety Difficulty Not difficult at all Not difficult at all Not difficult at all Not difficult at all       10/27/2022   10:11 AM 10/13/2022    1:42 PM 09/15/2022   11:21 AM  Depression screen PHQ 2/9  Decreased Interest 0 0 0  Down, Depressed, Hopeless 0 0 0  PHQ - 2 Score 0  0 0  Altered sleeping 0 0 0  Tired, decreased energy 0 0 0  Change in appetite 0 0 0  Feeling bad or failure about yourself  0 0 0  Trouble concentrating 0 0 0  Moving slowly or fidgety/restless 0 0 0  Suicidal thoughts 0 0 0  PHQ-9 Score 0 0 0  Difficult doing work/chores Not difficult at all Not difficult at all Not difficult at all    BP Readings from Last 3 Encounters:  10/27/22 124/78  10/13/22 122/78  09/15/22 118/68    Physical Exam Vitals and nursing note reviewed.  Constitutional:      General: She is not in acute distress.    Appearance: She is well-developed.  HENT:     Head: Normocephalic and atraumatic.     Right Ear: Tympanic membrane and ear canal normal.     Left Ear: Tympanic membrane and ear canal normal.     Nose:     Right Sinus: No maxillary sinus tenderness.     Left Sinus: No maxillary sinus tenderness.  Eyes:     General: No scleral icterus.       Right eye: No discharge.        Left eye: No discharge.     Conjunctiva/sclera: Conjunctivae normal.  Neck:     Thyroid: No thyromegaly.     Vascular: No carotid bruit.  Cardiovascular:     Rate and Rhythm: Normal rate and regular rhythm.     Pulses: Normal pulses.     Heart sounds: Normal heart sounds.  Pulmonary:     Effort: Pulmonary effort is normal. No respiratory distress.     Breath sounds: No wheezing.  Chest:  Breasts:    Right: No mass, nipple discharge, skin change or tenderness.     Left: No mass, nipple discharge, skin change or tenderness.  Abdominal:     General: Bowel sounds are normal.     Palpations: Abdomen is soft.     Tenderness: There is no abdominal tenderness.  Musculoskeletal:     Cervical back: Normal range of motion. No erythema.     Right lower leg: No edema.     Left lower leg: No edema.  Lymphadenopathy:     Cervical: No cervical adenopathy.  Skin:    General: Skin is warm and dry.     Findings: No rash.  Neurological:     Mental Status: She is alert and  oriented to person, place, and time.     Cranial Nerves: No cranial nerve deficit.  Sensory: No sensory deficit.     Deep Tendon Reflexes: Reflexes are normal and symmetric.  Psychiatric:        Attention and Perception: Attention normal.        Mood and Affect: Mood normal.    Lab Results  Component Value Date   COLORU yellow 10/27/2022   CLARITYU clear 10/27/2022   GLUCOSEUR Negative 10/27/2022   BILIRUBINUR neg 10/27/2022   KETONESU neg 10/27/2022   SPECGRAV 1.015 10/27/2022   RBCUR neg 10/27/2022   PHUR 7.0 10/27/2022   PROTEINUR Negative 10/27/2022   UROBILINOGEN 0.2 10/27/2022   LEUKOCYTESUR Negative 10/27/2022      Wt Readings from Last 3 Encounters:  10/27/22 223 lb 3.2 oz (101.2 kg)  10/13/22 224 lb (101.6 kg)  09/15/22 220 lb 3.2 oz (99.9 kg)    BP 124/78   Pulse 68   Ht 5\' 6"  (1.676 m)   Wt 223 lb 3.2 oz (101.2 kg)   SpO2 98%   BMI 36.03 kg/m   Assessment and Plan:  Problem List Items Addressed This Visit       Unprioritized   Primary hypertension (Chronic)    Normal exam with stable BP on losartan and hctz. Having ongoing leg cramps despite magnesium and potassium supplements Check labs, consider lowering dose of chlorthalidone or increasing dose of K+ or Mg++        Relevant Medications   losartan (COZAAR) 25 MG tablet   Other Relevant Orders   CBC with Differential/Platelet   Comprehensive metabolic panel   TSH   POCT urinalysis dipstick (Completed)   Prediabetes    Managed with diet and lifestyle. Lab Results  Component Value Date   HGBA1C 6.1 (H) 06/07/2022  Continue current management       Relevant Orders   Hemoglobin A1c   Mixed hyperlipidemia    LDL is 138. Currently being treated with lifestyle changes with good compliance.      Relevant Medications   losartan (COZAAR) 25 MG tablet   Other Relevant Orders   Lipid panel   Migraine with aura and without status migrainosus, not intractable    No recent change in  the character or frequency of headaches. Headaches respond well to current therapy with Imitrex. Will continue current plan and follow up if worsening.       Relevant Medications   losartan (COZAAR) 25 MG tablet   Gouty arthritis of left great toe   Relevant Orders   Uric acid   Other Visit Diagnoses     Annual physical exam    -  Primary   Encounter for screening mammogram for breast cancer       scheduled   Need for hepatitis C screening test       Relevant Orders   Hepatitis C antibody   Encounter for screening for osteoporosis       Relevant Orders   DG Bone Density   Need for influenza vaccination       Relevant Orders   Flu Vaccine Trivalent High Dose (Fluad) (Completed)   Tendonitis of wrist, left       still bothersome - recommend Voltaren gel qid       Return in about 4 months (around 02/26/2023) for HTN, gout.    Reubin Milan, MD [facility] [place]

## 2022-10-28 ENCOUNTER — Other Ambulatory Visit: Payer: Self-pay

## 2022-10-28 ENCOUNTER — Telehealth: Payer: Self-pay | Admitting: Internal Medicine

## 2022-10-28 LAB — HEPATITIS C ANTIBODY: Hep C Virus Ab: NONREACTIVE

## 2022-10-28 LAB — LIPID PANEL
Chol/HDL Ratio: 6.4 ratio — ABNORMAL HIGH (ref 0.0–4.4)
Cholesterol, Total: 216 mg/dL — ABNORMAL HIGH (ref 100–199)
HDL: 34 mg/dL — ABNORMAL LOW (ref >39)
LDL Chol Calc (NIH): 135 mg/dL — ABNORMAL HIGH (ref 0–99)
Triglycerides: 260 mg/dL — ABNORMAL HIGH (ref 0–149)
VLDL Cholesterol Cal: 47 mg/dL — ABNORMAL HIGH (ref 5–40)

## 2022-10-28 LAB — COMPREHENSIVE METABOLIC PANEL
ALT: 49 IU/L — ABNORMAL HIGH (ref 0–32)
AST: 46 IU/L — ABNORMAL HIGH (ref 0–40)
Albumin: 4.5 g/dL (ref 3.9–4.9)
Alkaline Phosphatase: 53 IU/L (ref 44–121)
BUN/Creatinine Ratio: 13 (ref 12–28)
BUN: 12 mg/dL (ref 8–27)
Bilirubin Total: 0.3 mg/dL (ref 0.0–1.2)
CO2: 26 mmol/L (ref 20–29)
Calcium: 9.2 mg/dL (ref 8.7–10.3)
Chloride: 99 mmol/L (ref 96–106)
Creatinine, Ser: 0.96 mg/dL (ref 0.57–1.00)
Globulin, Total: 2.3 g/dL (ref 1.5–4.5)
Glucose: 104 mg/dL — ABNORMAL HIGH (ref 70–99)
Potassium: 3.6 mmol/L (ref 3.5–5.2)
Sodium: 140 mmol/L (ref 134–144)
Total Protein: 6.8 g/dL (ref 6.0–8.5)
eGFR: 66 mL/min/{1.73_m2} (ref >59)

## 2022-10-28 LAB — CBC WITH DIFFERENTIAL/PLATELET
Basophils Absolute: 0 10*3/uL (ref 0.0–0.2)
Basos: 1 %
EOS (ABSOLUTE): 0.3 10*3/uL (ref 0.0–0.4)
Eos: 4 %
Hematocrit: 39.6 % (ref 34.0–46.6)
Hemoglobin: 13.1 g/dL (ref 11.1–15.9)
Immature Grans (Abs): 0 10*3/uL (ref 0.0–0.1)
Immature Granulocytes: 0 %
Lymphocytes Absolute: 2.8 10*3/uL (ref 0.7–3.1)
Lymphs: 39 %
MCH: 31.1 pg (ref 26.6–33.0)
MCHC: 33.1 g/dL (ref 31.5–35.7)
MCV: 94 fL (ref 79–97)
Monocytes Absolute: 0.7 10*3/uL (ref 0.1–0.9)
Monocytes: 10 %
Neutrophils Absolute: 3.4 10*3/uL (ref 1.4–7.0)
Neutrophils: 46 %
Platelets: 245 10*3/uL (ref 150–450)
RBC: 4.21 x10E6/uL (ref 3.77–5.28)
RDW: 13.1 % (ref 11.7–15.4)
WBC: 7.3 10*3/uL (ref 3.4–10.8)

## 2022-10-28 LAB — HEMOGLOBIN A1C
Est. average glucose Bld gHb Est-mCnc: 134 mg/dL
Hgb A1c MFr Bld: 6.3 % — ABNORMAL HIGH (ref 4.8–5.6)

## 2022-10-28 LAB — URIC ACID: Uric Acid: 6.6 mg/dL (ref 3.0–7.2)

## 2022-10-28 LAB — TSH: TSH: 2.68 u[IU]/mL (ref 0.450–4.500)

## 2022-10-28 MED ORDER — ROSUVASTATIN CALCIUM 5 MG PO TABS: 5.0000 mg | ORAL_TABLET | Freq: Every day | ORAL | 1 refills | Status: DC

## 2022-10-28 NOTE — Telephone Encounter (Signed)
Please review.  KP

## 2022-10-28 NOTE — Telephone Encounter (Signed)
Pt states after looking at the med side effects of Rosuvastatin, she wants to know if Dr Asencion Partridge would be OK w/ pt controlling her cholesterol with diet and exercise?  Rather than starting another medication.

## 2022-10-29 ENCOUNTER — Other Ambulatory Visit: Payer: Self-pay | Admitting: Internal Medicine

## 2022-10-29 DIAGNOSIS — M109 Gout, unspecified: Secondary | ICD-10-CM

## 2022-10-29 NOTE — Telephone Encounter (Signed)
Called pt with Dr. Jaclynn Guarneri note. Pt verbalized understanding.  KP

## 2022-11-02 ENCOUNTER — Ambulatory Visit
Admission: RE | Admit: 2022-11-02 | Discharge: 2022-11-02 | Disposition: A | Discharge status: Home / Self Care | Payer: PPO | Source: Ambulatory Visit | Attending: Internal Medicine | Admitting: Internal Medicine

## 2022-11-02 DIAGNOSIS — Z1231 Encounter for screening mammogram for malignant neoplasm of breast: Secondary | ICD-10-CM | POA: Insufficient documentation

## 2022-11-04 ENCOUNTER — Other Ambulatory Visit: Payer: Self-pay | Admitting: *Deleted

## 2022-11-04 ENCOUNTER — Inpatient Hospital Stay
Admission: RE | Admit: 2022-11-04 | Discharge: 2022-11-04 | Disposition: A | Discharge status: Home / Self Care | Payer: Self-pay | Source: Ambulatory Visit | Attending: Internal Medicine | Admitting: Internal Medicine

## 2022-11-04 DIAGNOSIS — Z1231 Encounter for screening mammogram for malignant neoplasm of breast: Secondary | ICD-10-CM

## 2022-11-09 ENCOUNTER — Ambulatory Visit
Admission: RE | Admit: 2022-11-09 | Discharge: 2022-11-09 | Disposition: A | Discharge status: Home / Self Care | Payer: PPO | Source: Ambulatory Visit | Attending: Internal Medicine | Admitting: Internal Medicine

## 2022-11-09 DIAGNOSIS — Z13820 Encounter for screening for osteoporosis: Secondary | ICD-10-CM | POA: Insufficient documentation

## 2022-11-09 DIAGNOSIS — Z780 Asymptomatic menopausal state: Secondary | ICD-10-CM | POA: Diagnosis not present

## 2022-11-09 DIAGNOSIS — Z90710 Acquired absence of both cervix and uterus: Secondary | ICD-10-CM | POA: Insufficient documentation

## 2022-11-09 DIAGNOSIS — E559 Vitamin D deficiency, unspecified: Secondary | ICD-10-CM | POA: Diagnosis not present

## 2022-11-10 DIAGNOSIS — M654 Radial styloid tenosynovitis [de Quervain]: Secondary | ICD-10-CM | POA: Diagnosis not present

## 2022-12-29 ENCOUNTER — Other Ambulatory Visit: Payer: Self-pay

## 2022-12-29 ENCOUNTER — Ambulatory Visit: Payer: Self-pay

## 2022-12-29 DIAGNOSIS — M109 Gout, unspecified: Secondary | ICD-10-CM

## 2022-12-29 MED ORDER — PREDNISONE 10 MG PO TABS: ORAL_TABLET | ORAL | 0 refills | Status: AC

## 2022-12-29 NOTE — Telephone Encounter (Signed)
Called and spoke with patient. Per Dr Judithann Graves sent in another round of prednisone. Told patient to stop colchicine while take this, but continue allopurinol daily.   - Lynn Tapia

## 2022-12-29 NOTE — Telephone Encounter (Signed)
Chief Complaint: Left great toe pain, gout flare Symptoms: left toe swelling, redness, 10/10 pain, hard to walk Frequency: Onset yesterday Pertinent Negatives: Patient denies other symptoms Disposition: [] ED /[] Urgent Care (no appt availability in office) / [x] Appointment(In office/virtual)/ []  [facility]/ [] Home Care/ [x] Refused Recommended Disposition /[] [facility]/ []  Follow-up with PCP Additional Notes: Patient says she has a gout flare and refuses to be scheduled, says ask Dr. Judithann Graves to call in prednisone. She says she took Colchicine 2 tabs at 1430 and 1 tab at 1525 today. She asked is she supposed to only take it with a gout flare or BID as it is on the bottle. Advised that's how it normally is taken, but would need clarification from Dr. Judithann Graves. Advised I will send this to Dr. Judithann Graves for clarification and for the prednisone request. Advised someone will call with her recommendation.  Preferred Pharmacy:: [id number] - [place], [place] - [address] Phone: [phone]  Fax: [phone]      Summary: Gout flare up   The patient has had a flare up of gout around her left big toe. She has some questions regarding her medication she takes for gout arthritis as well as prednisone.  Please assist patient further.     Reason for Disposition  [1] SEVERE pain (e.g., excruciating, unable to do any normal activities) AND [2] not improved after 2 hours of pain medicine  Answer Assessment - Initial Assessment Questions 1. ONSET: "When did the pain start?"      Yesterday 2. LOCATION: "Where is the pain located?"   (e.g., around nail, entire toe, at foot joint)      Left great toe 3. PAIN: "How bad is the pain?"    (Scale 1-10; or mild, moderate, severe)   -  MILD (1-3): doesn't interfere with normal activities    -  MODERATE (4-7): interferes with normal activities (e.g., work or school) or awakens from sleep, limping    -  SEVERE (8-10): excruciating pain,  unable to do any normal activities, unable to walk     10 unable to walk 4. APPEARANCE: "What does the toe look like?" (e.g., redness, swelling, bruising, pallor)     Red and swollen 5. CAUSE: "What do you think is causing the toe pain?"     Gout flare 6. OTHER SYMPTOMS: "Do you have any other symptoms?" (e.g., leg pain, rash, fever, numbness)     No  Protocols used: Toe Pain-A-AH

## 2022-12-29 NOTE — Telephone Encounter (Signed)
Patient called, left VM to return the call to the office to speak to the NT.    Summary: Gout flare up   The patient has had a flare up of gout around her left big toe. She has some questions regarding her medication she takes for gout arthritis as well as prednisone.  Please assist patient further.

## 2023-01-05 DIAGNOSIS — H2513 Age-related nuclear cataract, bilateral: Secondary | ICD-10-CM | POA: Diagnosis not present

## 2023-01-13 DIAGNOSIS — D2261 Melanocytic nevi of right upper limb, including shoulder: Secondary | ICD-10-CM | POA: Diagnosis not present

## 2023-01-13 DIAGNOSIS — D225 Melanocytic nevi of trunk: Secondary | ICD-10-CM | POA: Diagnosis not present

## 2023-01-13 DIAGNOSIS — L538 Other specified erythematous conditions: Secondary | ICD-10-CM | POA: Diagnosis not present

## 2023-01-13 DIAGNOSIS — X32XXXA Exposure to sunlight, initial encounter: Secondary | ICD-10-CM | POA: Diagnosis not present

## 2023-01-13 DIAGNOSIS — L308 Other specified dermatitis: Secondary | ICD-10-CM | POA: Diagnosis not present

## 2023-01-13 DIAGNOSIS — D485 Neoplasm of uncertain behavior of skin: Secondary | ICD-10-CM | POA: Diagnosis not present

## 2023-01-13 DIAGNOSIS — D2271 Melanocytic nevi of right lower limb, including hip: Secondary | ICD-10-CM | POA: Diagnosis not present

## 2023-01-13 DIAGNOSIS — L570 Actinic keratosis: Secondary | ICD-10-CM | POA: Diagnosis not present

## 2023-01-13 DIAGNOSIS — L820 Inflamed seborrheic keratosis: Secondary | ICD-10-CM | POA: Diagnosis not present

## 2023-01-13 DIAGNOSIS — D2272 Melanocytic nevi of left lower limb, including hip: Secondary | ICD-10-CM | POA: Diagnosis not present

## 2023-01-13 DIAGNOSIS — D235 Other benign neoplasm of skin of trunk: Secondary | ICD-10-CM | POA: Diagnosis not present

## 2023-01-13 DIAGNOSIS — L309 Dermatitis, unspecified: Secondary | ICD-10-CM | POA: Diagnosis not present

## 2023-01-21 DIAGNOSIS — M7702 Medial epicondylitis, left elbow: Secondary | ICD-10-CM | POA: Diagnosis not present

## 2023-01-28 ENCOUNTER — Encounter: Payer: Self-pay | Admitting: Internal Medicine

## 2023-01-28 ENCOUNTER — Ambulatory Visit (INDEPENDENT_AMBULATORY_CARE_PROVIDER_SITE_OTHER): Payer: PPO | Admitting: Internal Medicine

## 2023-01-28 ENCOUNTER — Other Ambulatory Visit: Payer: Self-pay | Admitting: Internal Medicine

## 2023-01-28 VITALS — BP 128/74 | HR 60 | Ht 66.0 in | Wt 222.0 lb

## 2023-01-28 DIAGNOSIS — E782 Mixed hyperlipidemia: Secondary | ICD-10-CM

## 2023-01-28 DIAGNOSIS — M109 Gout, unspecified: Secondary | ICD-10-CM

## 2023-01-28 DIAGNOSIS — I10 Essential (primary) hypertension: Secondary | ICD-10-CM | POA: Diagnosis not present

## 2023-01-28 DIAGNOSIS — R7303 Prediabetes: Secondary | ICD-10-CM

## 2023-01-28 MED ORDER — ALLOPURINOL 200 MG PO TABS: 200.0000 mg | ORAL_TABLET | Freq: Every day | ORAL | 1 refills | Status: DC

## 2023-01-28 NOTE — Assessment & Plan Note (Signed)
She is not interested in statin medications. Will work harder on diet and exercise and recheck next visit.

## 2023-01-28 NOTE — Assessment & Plan Note (Signed)
Recommend resuming regular exercise; make diet changes

## 2023-01-28 NOTE — Assessment & Plan Note (Signed)
Controlled BP with normal exam. Current regimen is losartan, chlorthalidone. Will continue same medications; encourage continued reduced sodium diet.

## 2023-01-28 NOTE — Assessment & Plan Note (Signed)
Continues to have recurrent flares requiring steroid taper. Will increase Allopurinol to 200 mg.

## 2023-01-28 NOTE — Progress Notes (Signed)
Date:  01/28/2023   Name:  Lynn Tapia   DOB:  [birth year]   MRN:  [id number]   Chief Complaint: Hypertension, Diabetes, and Hyperlipidemia  Hypertension This is a chronic problem. The problem is controlled. Pertinent negatives include no chest pain, headaches, palpitations or shortness of breath.  Diabetes She presents for her follow-up diabetic visit. Diabetes type: prediabetes. Her disease course has been stable. Pertinent negatives for hypoglycemia include no dizziness or headaches. Pertinent negatives for diabetes include no chest pain, no fatigue and no weakness.  Hyperlipidemia This is a chronic problem. Recent lipid tests were reviewed and are high. Pertinent negatives include no chest pain or shortness of breath. She is currently on no antihyperlipidemic treatment.    Review of Systems  Constitutional:  Negative for fatigue and unexpected weight change.  HENT:  Negative for nosebleeds.   Eyes:  Negative for visual disturbance.  Respiratory:  Negative for cough, chest tightness, shortness of breath and wheezing.   Cardiovascular:  Negative for chest pain, palpitations and leg swelling.  Gastrointestinal:  Negative for abdominal pain, constipation and diarrhea.  Neurological:  Negative for dizziness, weakness, light-headedness and headaches.     Lab Results  Component Value Date   NA 140 10/27/2022   K 3.6 10/27/2022   CO2 26 10/27/2022   GLUCOSE 104 (H) 10/27/2022   BUN 12 10/27/2022   CREATININE 0.96 10/27/2022   CALCIUM 9.2 10/27/2022   EGFR 66 10/27/2022   GFRNONAA >60 10/13/2022   Lab Results  Component Value Date   CHOL 216 (H) 10/27/2022   HDL 34 (L) 10/27/2022   LDLCALC 135 (H) 10/27/2022   TRIG 260 (H) 10/27/2022   CHOLHDL 6.4 (H) 10/27/2022   Lab Results  Component Value Date   TSH 2.680 10/27/2022   Lab Results  Component Value Date   HGBA1C 6.3 (H) 10/27/2022   Lab Results  Component Value Date   WBC 7.3 10/27/2022   HGB 13.1  10/27/2022   HCT 39.6 10/27/2022   MCV 94 10/27/2022   PLT 245 10/27/2022   Lab Results  Component Value Date   ALT 49 (H) 10/27/2022   AST 46 (H) 10/27/2022   ALKPHOS 53 10/27/2022   BILITOT 0.3 10/27/2022   No results found for: "25OHVITD2", "25OHVITD3", "VD25OH"   Patient Active Problem List   Diagnosis Date Noted   Obesity, morbid (HCC) 01/28/2023   Prediabetes 10/27/2022   B12 deficiency 06/07/2022   Gouty arthritis of left great toe 05/30/2022   Gouty arthritis 05/21/2022   Gastroesophageal reflux disease 05/21/2022   BMI 35.0-35.9,adult 05/21/2022   Gastritis 05/13/2022   Superior labrum anterior-to-posterior (SLAP) tear of right shoulder 02/11/2022   Cervical spondylosis 05/22/2021   Family history of breast cancer in first degree relative 09/16/2020   Migraine with aura and without status migrainosus, not intractable 09/16/2020   Mixed hyperlipidemia 03/09/2018   Asymptomatic microscopic hematuria 04/29/2016   Chronic idiopathic constipation 04/20/2016   Internal hemorrhoids with complication 09/17/2015   Rectocele 09/17/2015   History of adenomatous polyp of colon 03/06/2015   Hypertensive heart disease without CHF 11/28/2014   Primary hypertension 11/26/2012    Allergies  Allergen Reactions   Penicillins     Reports this happened at 26 months old    Sulfa Antibiotics Rash   Telmisartan Other (See Comments)    Patient reports nightmares     Past Surgical History:  Procedure Laterality Date   ABDOMINAL HYSTERECTOMY  03/2008   ANUS SURGERY  12/2018   CHOLECYSTECTOMY  2002   at [facility] ARTHROSCOPY WITH SUBACROMIAL DECOMPRESSION, ROTATOR CUFF REPAIR AND BICEP TENDON REPAIR Right 02/11/2022   Procedure: SHOULDER ARTHROSCOPY WITH DEBRIDEMENT, DECOMPRESSION, ROTATOR CUFF REPAIR AND BIPEPS TENODESIS.;  Surgeon: Christena Flake, MD;  Location: [facility] ORS;  Service: Orthopedics;  Laterality: Right;    Social History   Tobacco Use   Smoking status: Never    Smokeless tobacco: Never  Vaping Use   Vaping status: Never Used  Substance Use Topics   Alcohol use: Never   Drug use: Never     Medication list has been reviewed and updated.  Current Meds  Medication Sig   acyclovir ointment (ZOVIRAX) 5 % Apply 1 Application topically every 3 (three) hours.   ascorbic acid (VITAMIN C) 1000 MG tablet Take 1,000 mg by mouth daily.   chlorthalidone (HYGROTON) 25 MG tablet Take 25 mg by mouth daily.   cholecalciferol (VITAMIN D3) 25 MCG (1000 UNIT) tablet Take 1,000 Units by mouth daily.   colchicine 0.6 MG tablet TAKE 1 TABLET BY MOUTH 2 TIMES DAILY.   cyanocobalamin (VITAMIN B12) 1000 MCG tablet Take 1,000 mcg by mouth daily.   docusate sodium (COLACE) 100 MG capsule Take 100 mg by mouth 2 (two) times daily.   fluocinonide cream (LIDEX) 0.05 % Apply 1 Application topically 2 (two) times daily.   fluticasone (FLONASE) 50 MCG/ACT nasal spray INSTILL 2 SPRAYS BY EACH NARE ROUTE DAILY.   hydrocortisone (ANUSOL-HC) 2.5 % rectal cream Place 1 Application rectally 2 (two) times daily.   losartan (COZAAR) 25 MG tablet Take 1 tablet (25 mg total) by mouth daily.   magnesium oxide (MAG-OX) 400 (240 Mg) MG tablet Take 400 mg by mouth daily.   polyethylene glycol (MIRALAX / GLYCOLAX) 17 g packet Take 17 g by mouth daily.   potassium chloride (KLOR-CON M10) 10 MEQ tablet Take 20 mEq by mouth daily.   rosuvastatin (CRESTOR) 5 MG tablet Take 1 tablet (5 mg total) by mouth daily.   SUMAtriptan (IMITREX) 50 MG tablet Take 1 tablet (50 mg total) by mouth every 2 (two) hours as needed for migraine. May repeat in 2 hours if headache persists or recurs.   triamcinolone cream (KENALOG) 0.1 % Apply 1 Application topically 2 (two) times daily.   [DISCONTINUED] allopurinol (ZYLOPRIM) 100 MG tablet TAKE 1 TABLET BY MOUTH EVERY DAY       10/27/2022   10:11 AM 10/13/2022    1:42 PM 09/15/2022   11:21 AM 07/27/2022    9:37 AM  GAD 7 : Generalized Anxiety Score  Nervous,  Anxious, on Edge 0 0 0 0  Control/stop worrying 0 0 0 0  Worry too much - different things 0 0 0 0  Trouble relaxing 0 0 0 0  Restless 0 0 0 0  Easily annoyed or irritable 0 0 0 0  Afraid - awful might happen 0 0 0 0  Total GAD 7 Score 0 0 0 0  Anxiety Difficulty Not difficult at all Not difficult at all Not difficult at all Not difficult at all       10/27/2022   10:11 AM 10/13/2022    1:42 PM 09/15/2022   11:21 AM  Depression screen PHQ 2/9  Decreased Interest 0 0 0  Down, Depressed, Hopeless 0 0 0  PHQ - 2 Score 0 0 0  Altered sleeping 0 0 0  Tired, decreased energy 0 0 0  Change in appetite 0 0 0  Feeling bad or failure about yourself  0 0 0  Trouble concentrating 0 0 0  Moving slowly or fidgety/restless 0 0 0  Suicidal thoughts 0 0 0  PHQ-9 Score 0 0 0  Difficult doing work/chores Not difficult at all Not difficult at all Not difficult at all    BP Readings from Last 3 Encounters:  01/28/23 128/74  10/27/22 124/78  10/13/22 122/78    Physical Exam Vitals and nursing note reviewed.  Constitutional:      General: She is not in acute distress.    Appearance: She is well-developed. She is obese.  HENT:     Head: Normocephalic and atraumatic.  Neck:     Vascular: No carotid bruit.  Cardiovascular:     Rate and Rhythm: Normal rate and regular rhythm.     Pulses: Normal pulses.     Heart sounds: No murmur heard. Pulmonary:     Effort: Pulmonary effort is normal. No respiratory distress.     Breath sounds: No wheezing or rhonchi.  Musculoskeletal:     Cervical back: Normal range of motion.     Right lower leg: No edema.     Left lower leg: No edema.  Lymphadenopathy:     Cervical: No cervical adenopathy.  Skin:    General: Skin is warm and dry.     Capillary Refill: Capillary refill takes less than 2 seconds.     Findings: No rash.  Neurological:     General: No focal deficit present.     Mental Status: She is alert and oriented to person, place, and time.   Psychiatric:        Mood and Affect: Mood normal.        Behavior: Behavior normal.     Wt Readings from Last 3 Encounters:  01/28/23 222 lb (100.7 kg)  10/27/22 223 lb 3.2 oz (101.2 kg)  10/13/22 224 lb (101.6 kg)    BP 128/74   Pulse 60   Ht 5\' 6"  (1.676 m)   Wt 222 lb (100.7 kg)   SpO2 95%   BMI 35.83 kg/m   Assessment and Plan:  Problem List Items Addressed This Visit       Unprioritized   Mixed hyperlipidemia   She is not interested in statin medications. Will work harder on diet and exercise and recheck next visit.      Primary hypertension - Primary (Chronic)   Controlled BP with normal exam. Current regimen is losartan, chlorthalidone. Will continue same medications; encourage continued reduced sodium diet.       Gouty arthritis   Continues to have recurrent flares requiring steroid taper. Will increase Allopurinol to 200 mg.      Relevant Medications   allopurinol 200 MG TABS   Prediabetes   Discussed diet and exercise at length. Low carb diet choices; may be a candidate for GLP-1 in the new year      Obesity, morbid (HCC)   Recommend resuming regular exercise; make diet changes       No follow-ups on file.    Reubin Milan, MD [facility] [place]

## 2023-01-28 NOTE — Assessment & Plan Note (Signed)
Discussed diet and exercise at length. Low carb diet choices; may be a candidate for GLP-1 in the new year

## 2023-01-31 ENCOUNTER — Other Ambulatory Visit: Payer: Self-pay | Admitting: Internal Medicine

## 2023-01-31 DIAGNOSIS — M109 Gout, unspecified: Secondary | ICD-10-CM

## 2023-01-31 MED ORDER — ALLOPURINOL 100 MG PO TABS: 200.0000 mg | ORAL_TABLET | Freq: Every day | ORAL | 1 refills | Status: DC

## 2023-01-31 NOTE — Telephone Encounter (Signed)
Requested medication (s) are due for refill today - no  Requested medication (s) are on the active medication list -yes  Future visit scheduled -yes  Last refill: 01/28/23  Notes to clinic:   Pharmacy comment: Alternative Requested:200 MG IS NOT COVERED BY INS--MAY WANT TO CONSIDER 2 TABS OF 100MG  INSTEAD.    Requested Prescriptions  Pending Prescriptions Disp Refills   Allopurinol 200 MG TABS [Pharmacy Med Name: ALLOPURINOL 200 MG TABLET] 90 tablet 1    Sig: Take 200 mg by mouth daily.     Endocrinology:  Gout Agents - allopurinol Passed - 01/31/2023 11:31 AM      Passed - Uric Acid in normal range and within 360 days    Uric Acid  Date Value Ref Range Status  10/27/2022 6.6 3.0 - 7.2 mg/dL Final    Comment:               Therapeutic target for gout patients: <6.0         Passed - Cr in normal range and within 360 days    Creatinine, Ser  Date Value Ref Range Status  10/27/2022 0.96 0.57 - 1.00 mg/dL Final         Passed - Valid encounter within last 12 months    Recent Outpatient Visits           3 days ago Primary hypertension   [place] [facility] at [facility], Nyoka Cowden, MD   3 months ago Annual physical exam   [facility] at [facility], Nyoka Cowden, MD   3 months ago Gouty arthritis   [place] [facility] at [facility], Nyoka Cowden, MD   4 months ago Acute cough   [place] [facility] at [facility], Nyoka Cowden, MD   6 months ago Gouty arthritis of left great toe    Primary Care & Sports Medicine at [facility], Nyoka Cowden, MD       Future Appointments             In 3 months Judithann Graves Nyoka Cowden, MD [facility] at [facility], [facility]   In 9 months Judithann Graves Nyoka Cowden, MD [facility] at [facility], [facility]            Passed -  CBC within normal limits and completed in the last 12 months    WBC  Date Value Ref Range Status  10/27/2022 7.3 3.4 - 10.8 x10E3/uL Final   RBC  Date Value Ref Range Status  10/27/2022 4.21 3.77 - 5.28 x10E6/uL Final   Hemoglobin  Date Value Ref Range Status  10/27/2022 13.1 11.1 - 15.9 g/dL Final   Hematocrit  Date Value Ref Range Status  10/27/2022 39.6 34.0 - 46.6 % Final   MCHC  Date Value Ref Range Status  10/27/2022 33.1 31.5 - 35.7 g/dL Final   [facility]  Date Value Ref Range Status  10/27/2022 31.1 26.6 - 33.0 pg Final   MCV  Date Value Ref Range Status  10/27/2022 94 79 - 97 fL Final   No results found for: "PLTCOUNTKUC", "LABPLAT", "POCPLA" RDW  Date Value Ref Range Status  10/27/2022 13.1 11.7 - 15.4 % Final            Requested Prescriptions  Pending Prescriptions Disp Refills   Allopurinol 200  MG TABS [Pharmacy Med Name: ALLOPURINOL 200 MG TABLET] 90 tablet 1    Sig: Take 200 mg by mouth daily.     Endocrinology:  Gout Agents - allopurinol Passed - 01/31/2023 11:31 AM      Passed - Uric Acid in normal range and within 360 days    Uric Acid  Date Value Ref Range Status  10/27/2022 6.6 3.0 - 7.2 mg/dL Final    Comment:               Therapeutic target for gout patients: <6.0         Passed - Cr in normal range and within 360 days    Creatinine, Ser  Date Value Ref Range Status  10/27/2022 0.96 0.57 - 1.00 mg/dL Final         Passed - Valid encounter within last 12 months    Recent Outpatient Visits           3 days ago Primary hypertension   [place] [facility] at [facility], Nyoka Cowden, MD   3 months ago Annual physical exam   [facility] at [facility], Nyoka Cowden, MD   3 months ago Gouty arthritis   [place] [facility] at [facility], Nyoka Cowden, MD   4 months ago Acute cough   [place] [facility] at  [facility], Nyoka Cowden, MD   6 months ago Gouty arthritis of left great toe    Primary Care & Sports Medicine at [facility], Nyoka Cowden, MD       Future Appointments             In 3 months Judithann Graves Nyoka Cowden, MD [facility] Medicine at [facility], [facility]   In 9 months Judithann Graves Nyoka Cowden, MD [facility] Primary Care & [facility] Medicine at [facility], [facility]            Passed - CBC within normal limits and completed in the last 12 months    WBC  Date Value Ref Range Status  10/27/2022 7.3 3.4 - 10.8 x10E3/uL Final   RBC  Date Value Ref Range Status  10/27/2022 4.21 3.77 - 5.28 x10E6/uL Final   Hemoglobin  Date Value Ref Range Status  10/27/2022 13.1 11.1 - 15.9 g/dL Final   Hematocrit  Date Value Ref Range Status  10/27/2022 39.6 34.0 - 46.6 % Final   MCHC  Date Value Ref Range Status  10/27/2022 33.1 31.5 - 35.7 g/dL Final   [facility]  Date Value Ref Range Status  10/27/2022 31.1 26.6 - 33.0 pg Final   MCV  Date Value Ref Range Status  10/27/2022 94 79 - 97 fL Final   No results found for: "PLTCOUNTKUC", "LABPLAT", "POCPLA" RDW  Date Value Ref Range Status  10/27/2022 13.1 11.7 - 15.4 % Final

## 2023-01-31 NOTE — Telephone Encounter (Signed)
Please review.  KP

## 2023-02-10 DIAGNOSIS — H40053 Ocular hypertension, bilateral: Secondary | ICD-10-CM | POA: Diagnosis not present

## 2023-02-10 DIAGNOSIS — H2513 Age-related nuclear cataract, bilateral: Secondary | ICD-10-CM | POA: Diagnosis not present

## 2023-02-10 DIAGNOSIS — H04123 Dry eye syndrome of bilateral lacrimal glands: Secondary | ICD-10-CM | POA: Diagnosis not present

## 2023-02-10 DIAGNOSIS — H43813 Vitreous degeneration, bilateral: Secondary | ICD-10-CM | POA: Diagnosis not present

## 2023-02-15 ENCOUNTER — Encounter: Payer: Self-pay | Admitting: Ophthalmology

## 2023-02-16 DIAGNOSIS — H2512 Age-related nuclear cataract, left eye: Secondary | ICD-10-CM | POA: Diagnosis not present

## 2023-02-17 ENCOUNTER — Telehealth: Payer: Self-pay | Admitting: Internal Medicine

## 2023-02-17 NOTE — Telephone Encounter (Signed)
 Called and informed patient.  - Lynn Tapia

## 2023-02-17 NOTE — Telephone Encounter (Signed)
 Copied from CRM [id number]. Topic: General - Inquiry >> Feb 17, 2023  8:29 AM Donnal HERO wrote: Reason for CRM: Pt stated she is going in to have cataract surgery, and she was asked a few questions, one being that she has congestive heart failure. Pt stated she does not have congestive heart failure.  Stated she contacted her cardiologist and confirmed that she does not have congestive heart failure. Pt stated she needs this removed. Please advise.

## 2023-02-17 NOTE — Telephone Encounter (Signed)
 Please review.  KP

## 2023-02-28 NOTE — Discharge Instructions (Signed)

## 2023-03-01 ENCOUNTER — Ambulatory Visit: Payer: PPO | Admitting: Internal Medicine

## 2023-03-02 ENCOUNTER — Ambulatory Visit: Payer: Self-pay | Admitting: Anesthesiology

## 2023-03-02 ENCOUNTER — Encounter: Payer: Self-pay | Admitting: Ophthalmology

## 2023-03-02 ENCOUNTER — Ambulatory Visit
Admission: RE | Admit: 2023-03-02 | Discharge: 2023-03-02 | Disposition: A | Discharge status: Home / Self Care | Payer: PPO | Attending: Ophthalmology | Admitting: Ophthalmology

## 2023-03-02 ENCOUNTER — Encounter
Admission: RE | Disposition: A | Discharge status: Home / Self Care | Payer: Self-pay | Source: Home / Self Care | Attending: Ophthalmology

## 2023-03-02 ENCOUNTER — Other Ambulatory Visit: Payer: Self-pay

## 2023-03-02 DIAGNOSIS — K760 Fatty (change of) liver, not elsewhere classified: Secondary | ICD-10-CM | POA: Diagnosis not present

## 2023-03-02 DIAGNOSIS — H2512 Age-related nuclear cataract, left eye: Secondary | ICD-10-CM | POA: Insufficient documentation

## 2023-03-02 DIAGNOSIS — I5032 Chronic diastolic (congestive) heart failure: Secondary | ICD-10-CM | POA: Insufficient documentation

## 2023-03-02 DIAGNOSIS — I110 Hypertensive heart disease with heart failure: Secondary | ICD-10-CM | POA: Diagnosis not present

## 2023-03-02 DIAGNOSIS — H269 Unspecified cataract: Secondary | ICD-10-CM | POA: Diagnosis not present

## 2023-03-02 DIAGNOSIS — I509 Heart failure, unspecified: Secondary | ICD-10-CM | POA: Diagnosis not present

## 2023-03-02 HISTORY — PX: CATARACT EXTRACTION W/PHACO: SHX586

## 2023-03-02 SURGERY — PHACOEMULSIFICATION, CATARACT, WITH IOL INSERTION
Anesthesia: Monitor Anesthesia Care | Site: Eye | Laterality: Left

## 2023-03-02 MED ORDER — SIGHTPATH DOSE#1 BSS IO SOLN
INTRAOCULAR | Status: DC | PRN
  Administered 2023-03-02: 54 mL via OPHTHALMIC

## 2023-03-02 MED ORDER — FENTANYL CITRATE (PF) 100 MCG/2ML IJ SOLN
INTRAMUSCULAR | Status: DC | PRN
  Administered 2023-03-02: 50 ug via INTRAVENOUS

## 2023-03-02 MED ORDER — ARMC OPHTHALMIC DILATING DROPS
1.0000 | OPHTHALMIC | Status: DC | PRN
  Administered 2023-03-02 (×3): 1 via OPHTHALMIC

## 2023-03-02 MED ORDER — SIGHTPATH DOSE#1 BSS IO SOLN
INTRAOCULAR | Status: DC | PRN
  Administered 2023-03-02: 2 mL

## 2023-03-02 MED ORDER — TETRACAINE HCL 0.5 % OP SOLN
OPHTHALMIC | Status: AC
  Filled 2023-03-02: qty 4

## 2023-03-02 MED ORDER — MIDAZOLAM HCL 2 MG/2ML IJ SOLN
INTRAMUSCULAR | Status: AC
  Filled 2023-03-02: qty 2

## 2023-03-02 MED ORDER — FENTANYL CITRATE (PF) 100 MCG/2ML IJ SOLN
INTRAMUSCULAR | Status: AC
  Filled 2023-03-02: qty 2

## 2023-03-02 MED ORDER — SIGHTPATH DOSE#1 NA HYALUR & NA CHOND-NA HYALUR IO KIT
PACK | INTRAOCULAR | Status: DC | PRN
  Administered 2023-03-02: 1 via OPHTHALMIC

## 2023-03-02 MED ORDER — BRIMONIDINE TARTRATE-TIMOLOL 0.2-0.5 % OP SOLN
OPHTHALMIC | Status: DC | PRN
  Administered 2023-03-02: 1 [drp] via OPHTHALMIC

## 2023-03-02 MED ORDER — TETRACAINE HCL 0.5 % OP SOLN
1.0000 [drp] | OPHTHALMIC | Status: DC | PRN
  Administered 2023-03-02 (×3): 1 [drp] via OPHTHALMIC

## 2023-03-02 MED ORDER — CEFUROXIME OPHTHALMIC INJECTION 1 MG/0.1 ML
INJECTION | OPHTHALMIC | Status: DC | PRN
  Administered 2023-03-02: .1 mL via INTRACAMERAL

## 2023-03-02 MED ORDER — MIDAZOLAM HCL 2 MG/2ML IJ SOLN
INTRAMUSCULAR | Status: DC | PRN
  Administered 2023-03-02: 2 mg via INTRAVENOUS

## 2023-03-02 MED ORDER — SIGHTPATH DOSE#1 BSS IO SOLN
INTRAOCULAR | Status: DC | PRN
  Administered 2023-03-02: 15 mL via INTRAOCULAR

## 2023-03-02 MED ORDER — ARMC OPHTHALMIC DILATING DROPS
OPHTHALMIC | Status: AC
  Filled 2023-03-02: qty 0.5

## 2023-03-02 SURGICAL SUPPLY — 9 items
CATARACT SUITE SIGHTPATH (MISCELLANEOUS) ×1
FEE CATARACT SUITE SIGHTPATH (MISCELLANEOUS) ×1 IMPLANT
GLOVE SRG 8 PF TXTR STRL LF DI (GLOVE) ×1 IMPLANT
GLOVE SURG ENC TEXT LTX SZ7.5 (GLOVE) ×1 IMPLANT
LENS CLRN VIVITY 17.5 ×1 IMPLANT
LENS IOL CLRN VT YLW 17.5 IMPLANT
NDL FILTER BLUNT 18X1 1/2 (NEEDLE) ×1 IMPLANT
NEEDLE FILTER BLUNT 18X1 1/2 (NEEDLE) ×1
SYR 3ML LL SCALE MARK (SYRINGE) ×1 IMPLANT

## 2023-03-02 NOTE — Op Note (Signed)
OPERATIVE NOTE  Lynn Tapia [id number] 03/02/2023   PREOPERATIVE DIAGNOSIS:  Nuclear sclerotic cataract left eye. H25.12   POSTOPERATIVE DIAGNOSIS:    Nuclear sclerotic cataract left eye.     PROCEDURE:  Phacoemusification with posterior chamber intraocular lens placement of the left eye  Ultrasound time: Procedure(s): CATARACT EXTRACTION PHACO AND INTRAOCULAR LENS PLACEMENT (IOC) LEFT CLAREON VIVITY 3.52 00:19.1 (Left)  LENS:   Implant Name Type Inv. Item Serial No. Manufacturer Lot No. LRB No. Used Action  LENS CLRN VIVITY 17.5 - [id number]  LENS CLRN VIVITY 17.5 [id number] [employer]  Left 1 Implanted    [id number]  SURGEON:  Deirdre Evener, MD   ANESTHESIA:  Topical with tetracaine drops and 2% Xylocaine jelly, augmented with 1% preservative-free intracameral lidocaine.    COMPLICATIONS:  None.   DESCRIPTION OF PROCEDURE:  The patient was identified in the holding room and transported to the operating room and placed in the supine position under the operating microscope.  The left eye was identified as the operative eye and it was prepped and draped in the usual sterile ophthalmic fashion.   A 1 millimeter clear-corneal paracentesis was made at the 1:30 position.  0.5 ml of preservative-free 1% lidocaine was injected into the anterior chamber.  The anterior chamber was filled with Viscoat viscoelastic.  A 2.4 millimeter keratome was used to make a near-clear corneal incision at the 10:30 position.  .  A curvilinear capsulorrhexis was made with a cystotome and capsulorrhexis forceps.  Balanced salt solution was used to hydrodissect and hydrodelineate the nucleus.   Phacoemulsification was then used in stop and chop fashion to remove the lens nucleus and epinucleus.  The remaining cortex was then removed using the irrigation and aspiration handpiece. Provisc was then placed into the capsular bag to distend it for lens placement.  A lens was then injected into the capsular  bag.  The remaining viscoelastic was aspirated.   Wounds were hydrated with balanced salt solution.  The anterior chamber was inflated to a physiologic pressure with balanced salt solution.  No wound leaks were noted. Cefuroxime 0.1 ml of a 10mg /ml solution was injected into the anterior chamber for a dose of 1 mg of intracameral antibiotic at the completion of the case.   Timolol and Brimonidine drops were applied to the eye.  The patient was taken to the recovery room in stable condition without complications of anesthesia or surgery.  Jolly Bleicher 03/02/2023, 9:54 AM

## 2023-03-02 NOTE — Anesthesia Postprocedure Evaluation (Signed)
Anesthesia Post Note  Patient: Lynn Tapia  Procedure(s) Performed: CATARACT EXTRACTION PHACO AND INTRAOCULAR LENS PLACEMENT (IOC) LEFT CLAREON VIVITY 3.52 00:19.1 (Left: Eye)  Patient location during evaluation: PACU Anesthesia Type: MAC Level of consciousness: awake and alert Pain management: pain level controlled Vital Signs Assessment: post-procedure vital signs reviewed and stable Respiratory status: spontaneous breathing, nonlabored ventilation, respiratory function stable and patient connected to nasal cannula oxygen Cardiovascular status: stable and blood pressure returned to baseline Postop Assessment: no apparent nausea or vomiting Anesthetic complications: no   No notable events documented.   Last Vitals:  Vitals:   03/02/23 0956 03/02/23 1000  BP: 99/62 101/74  Pulse: 66 73  Resp: 12 13  Temp: (!) 36.2 C (!) 36.2 C  SpO2: 96% 95%    Last Pain:  Vitals:   03/02/23 1000  TempSrc:   PainSc: 0-No pain                 Lenard Simmer

## 2023-03-02 NOTE — Transfer of Care (Signed)
Immediate Anesthesia Transfer of Care Note  Patient: Lynn Tapia  Procedure(s) Performed: CATARACT EXTRACTION PHACO AND INTRAOCULAR LENS PLACEMENT (IOC) LEFT CLAREON VIVITY 3.52 00:19.1 (Left: Eye)  Patient Location: PACU  Anesthesia Type: MAC  Level of Consciousness: awake, alert  and patient cooperative  Airway and Oxygen Therapy: Patient Spontanous Breathing and Patient connected to supplemental oxygen  Post-op Assessment: Post-op Vital signs reviewed, Patient's Cardiovascular Status Stable, Respiratory Function Stable, Patent Airway and No signs of Nausea or vomiting  Post-op Vital Signs: Reviewed and stable  Complications: No notable events documented.

## 2023-03-02 NOTE — H&P (Signed)
[facility]   Primary Care Physician:  Reubin Milan, MD Ophthalmologist: Dr. Lockie Mola  Pre-Procedure History & Physical: HPI:  Lynn Tapia is a 68 y.o. female here for ophthalmic surgery.   Past Medical History:  Diagnosis Date   Adenomatous polyp of colon    Ankle swelling    Arthritis    CHF (congestive heart failure) (HCC)    Gout    Heart murmur    Hypertension    Hypokalemia    Migraine    Non-alcoholic fatty liver disease    Vitamin D deficiency, unspecified     Past Surgical History:  Procedure Laterality Date   ABDOMINAL HYSTERECTOMY  03/2008   ANUS SURGERY  12/2018   CHOLECYSTECTOMY  2002   at [facility] ARTHROSCOPY WITH SUBACROMIAL DECOMPRESSION, ROTATOR CUFF REPAIR AND BICEP TENDON REPAIR Right 02/11/2022   Procedure: SHOULDER ARTHROSCOPY WITH DEBRIDEMENT, DECOMPRESSION, ROTATOR CUFF REPAIR AND BIPEPS TENODESIS.;  Surgeon: Christena Flake, MD;  Location: [facility] ORS;  Service: Orthopedics;  Laterality: Right;    Prior to Admission medications   Medication Sig Start Date End Date Taking? Authorizing Provider  acetaminophen (TYLENOL) 500 MG tablet Take 1,000 mg by mouth every 6 (six) hours as needed.   Yes [provider]  acyclovir ointment (ZOVIRAX) 5 % Apply 1 Application topically every 3 (three) hours.   Yes [provider]  allopurinol (ZYLOPRIM) 100 MG tablet Take 2 tablets (200 mg total) by mouth daily. 01/31/23  Yes Reubin Milan, MD  ascorbic acid (VITAMIN C) 1000 MG tablet Take 1,000 mg by mouth daily.   Yes [provider]  chlorthalidone (HYGROTON) 25 MG tablet Take 25 mg by mouth daily.   Yes [provider]  cholecalciferol (VITAMIN D3) 25 MCG (1000 UNIT) tablet Take 1,000 Units by mouth daily.   Yes [provider]  colchicine 0.6 MG tablet TAKE 1 TABLET BY MOUTH 2 TIMES DAILY. 09/09/22  Yes Reubin Milan, MD  cyanocobalamin (VITAMIN B12) 1000 MCG tablet Take 1,000 mcg by  mouth daily.   Yes [provider]  docusate sodium (COLACE) 100 MG capsule Take 100 mg by mouth 2 (two) times daily.   Yes [provider]  fluocinonide cream (LIDEX) 0.05 % Apply 1 Application topically 2 (two) times daily.   Yes [provider]  fluticasone (FLONASE) 50 MCG/ACT nasal spray INSTILL 2 SPRAYS BY EACH NARE ROUTE DAILY. 11/14/17  Yes [provider]  ibuprofen (ADVIL) 200 MG tablet Take 200 mg by mouth every 6 (six) hours as needed.   Yes [provider]  losartan (COZAAR) 25 MG tablet Take 1 tablet (25 mg total) by mouth daily. 10/27/22  Yes Reubin Milan, MD  magnesium oxide (MAG-OX) 400 (240 Mg) MG tablet Take 400 mg by mouth daily.   Yes [provider]  polyethylene glycol (MIRALAX / GLYCOLAX) 17 g packet Take 17 g by mouth daily.   Yes [provider]  potassium chloride (KLOR-CON M10) 10 MEQ tablet Take 20 mEq by mouth daily.   Yes [provider]  SUMAtriptan (IMITREX) 50 MG tablet Take 1 tablet (50 mg total) by mouth every 2 (two) hours as needed for migraine. May repeat in 2 hours if headache persists or recurs. 10/08/22  Yes Reubin Milan, MD  triamcinolone cream (KENALOG) 0.1 % Apply 1 Application topically 2 (two) times daily.   Yes [provider]  hydrocortisone (ANUSOL-HC) 2.5 % rectal cream Place 1  Application rectally 2 (two) times daily. Patient not taking: Reported on 02/15/2023 07/27/22   Reubin Milan, MD  rosuvastatin (CRESTOR) 5 MG tablet Take 1 tablet (5 mg total) by mouth daily. Patient not taking: Reported on 02/15/2023 10/28/22   Reubin Milan, MD    Allergies as of 02/14/2023 - Review Complete 01/28/2023  Allergen Reaction Noted   Penicillins  02/03/2022   Sulfa antibiotics Rash 02/03/2022   Telmisartan Other (See Comments) 02/11/2022    Family History  Problem Relation Age of Onset   Breast cancer Mother 63   CAD Mother 28       MI during her breast cancer    Diabetes Mother    Cancer Mother 43       Breast, mastectomy, ultimately metastatic and caused her death   Cancer Father 47       Colon   Hypertension Father    Allergies Father    COPD Sister 83       Smoker   Asthma Sister    Diabetes Sister    Stroke Maternal Grandmother        Cerebral hemorrhage   CAD Maternal Grandfather 69   Emphysema Paternal Grandmother    Cancer Brother 2       Lung    Social History   Socioeconomic History   Marital status: Married    Spouse name: Not on file   Number of children: Not on file   Years of education: Not on file   Highest education level: GED or equivalent  Occupational History   Not on file  Tobacco Use   Smoking status: Never   Smokeless tobacco: Never  Vaping Use   Vaping status: Never Used  Substance and Sexual Activity   Alcohol use: Never   Drug use: Never   Sexual activity: Yes    Birth control/protection: None  Other Topics Concern   Not on file  Social History Narrative   Not on file   Social Drivers of Health   Financial Resource Strain: Low Risk  (01/24/2023)   Overall Financial Resource Strain (CARDIA)    Difficulty of Paying Living Expenses: Not hard at all  Food Insecurity: No Food Insecurity (01/24/2023)   Hunger Vital Sign    Worried About Running Out of Food in the Last Year: Never true    Ran Out of Food in the Last Year: Never true  Transportation Needs: No Transportation Needs (01/24/2023)   PRAPARE - Administrator, Civil Service (Medical): No    Lack of Transportation (Non-Medical): No  Physical Activity: Unknown (05/13/2022)   Exercise Vital Sign    Days of Exercise per Week: 0 days    Minutes of Exercise per Session: Not on file  Stress: No Stress Concern Present (01/24/2023)   [employer] of Occupational Health - Occupational Stress Questionnaire    Feeling of Stress : Not at all  Social Connections: Socially Integrated (01/24/2023)   Social Connection and Isolation  Panel [NHANES]    Frequency of Communication with Friends and Family: More than three times a week    Frequency of Social Gatherings with Friends and Family: More than three times a week    Attends Religious Services: More than 4 times per year    Active Member of [employer] or Organizations: Yes    Attends Banker Meetings: Not on file    Marital Status: Married  Intimate Partner Violence: Not At Risk (05/21/2022)   Humiliation, Afraid,  Rape, and Kick questionnaire    Fear of Current or Ex-Partner: No    Emotionally Abused: No    Physically Abused: No    Sexually Abused: No    Review of Systems: See HPI, otherwise negative ROS  Physical Exam: BP (!) 144/76   Temp 98.2 F (36.8 C) (Temporal)   Ht 5' 5.98" (1.676 m)   Wt 99.7 kg   SpO2 97%   BMI 35.51 kg/m  General:   Alert,  pleasant and cooperative in NAD Head:  Normocephalic and atraumatic. Lungs:  Clear to auscultation.    Heart:  Regular rate and rhythm.   Impression/Plan: Lynn Tapia is here for ophthalmic surgery.  Risks, benefits, limitations, and alternatives regarding ophthalmic surgery have been reviewed with the patient.  Questions have been answered.  All parties agreeable.   Lockie Mola, MD  03/02/2023, 8:46 AM

## 2023-03-02 NOTE — Anesthesia Preprocedure Evaluation (Signed)
Anesthesia Evaluation  Patient identified by MRN, date of birth, ID band Patient awake    Reviewed: Allergy & Precautions, NPO status , Patient's Chart, lab work & pertinent test results  History of Anesthesia Complications Negative for: history of anesthetic complications  Airway Mallampati: II   Neck ROM: Full    Dental  (+) Missing, Dental Advidsory Given   Pulmonary neg pulmonary ROS   Pulmonary exam normal breath sounds clear to auscultation       Cardiovascular hypertension, (-) angina +CHF (diastolic dysfunction)  (-) Past MI and (-) Cardiac Stents Normal cardiovascular exam(-) dysrhythmias + Valvular Problems/Murmurs  Rhythm:Regular Rate:Normal  Echo 01/12/22:    1. The left ventricle is normal in size with normal wall thickness.    2. The left ventricular systolic function is normal, LVEF is visually  estimated at > 55%.    3. There is mild aortic regurgitation.    4. The right ventricle is normal in size, with normal systolic function.   ECG 12/03/21:  NORMAL SINUS RHYTHM  LOW VOLTAGE QRS     Neuro/Psych  Headaches PSYCHIATRIC DISORDERS Anxiety        GI/Hepatic ,GERD  ,,NAFLD   Endo/Other  neg diabetes  Obesity   Renal/GU negative Renal ROS     Musculoskeletal  (+) Arthritis ,    Abdominal   Peds  Hematology negative hematology ROS (+)   Anesthesia Other Findings Past Medical History: No date: Adenomatous polyp of colon No date: Ankle swelling No date: Arthritis No date: CHF (congestive heart failure) (HCC) No date: Gout No date: Heart murmur No date: Hypertension No date: Hypokalemia No date: Migraine No date: Non-alcoholic fatty liver disease No date: Vitamin D deficiency, unspecified   Reproductive/Obstetrics                             Anesthesia Physical Anesthesia Plan  ASA: 2  Anesthesia Plan: MAC   Post-op Pain Management:    Induction:  Intravenous  PONV Risk Score and Plan: 2 and Treatment may vary due to age or medical condition and Midazolam  Airway Management Planned: Natural Airway and Nasal Cannula  Additional Equipment:   Intra-op Plan:   Post-operative Plan:   Informed Consent: I have reviewed the patients History and Physical, chart, labs and discussed the procedure including the risks, benefits and alternatives for the proposed anesthesia with the patient or authorized representative who has indicated his/her understanding and acceptance.     Dental advisory given  Plan Discussed with: CRNA  Anesthesia Plan Comments:         Anesthesia Quick Evaluation

## 2023-03-03 ENCOUNTER — Encounter: Payer: Self-pay | Admitting: Ophthalmology

## 2023-03-08 DIAGNOSIS — H2511 Age-related nuclear cataract, right eye: Secondary | ICD-10-CM | POA: Diagnosis not present

## 2023-03-14 NOTE — Discharge Instructions (Signed)

## 2023-03-16 ENCOUNTER — Encounter: Payer: Self-pay | Admitting: Ophthalmology

## 2023-03-16 ENCOUNTER — Ambulatory Visit: Payer: PPO | Admitting: Anesthesiology

## 2023-03-16 ENCOUNTER — Other Ambulatory Visit: Payer: Self-pay

## 2023-03-16 ENCOUNTER — Encounter
Admission: RE | Disposition: A | Discharge status: Home / Self Care | Payer: Self-pay | Source: Home / Self Care | Attending: Ophthalmology

## 2023-03-16 ENCOUNTER — Ambulatory Visit
Admission: RE | Admit: 2023-03-16 | Discharge: 2023-03-16 | Disposition: A | Discharge status: Home / Self Care | Payer: PPO | Attending: Ophthalmology | Admitting: Ophthalmology

## 2023-03-16 DIAGNOSIS — I509 Heart failure, unspecified: Secondary | ICD-10-CM | POA: Insufficient documentation

## 2023-03-16 DIAGNOSIS — H269 Unspecified cataract: Secondary | ICD-10-CM | POA: Diagnosis not present

## 2023-03-16 DIAGNOSIS — H2512 Age-related nuclear cataract, left eye: Secondary | ICD-10-CM | POA: Diagnosis not present

## 2023-03-16 DIAGNOSIS — H2511 Age-related nuclear cataract, right eye: Secondary | ICD-10-CM | POA: Diagnosis not present

## 2023-03-16 DIAGNOSIS — I110 Hypertensive heart disease with heart failure: Secondary | ICD-10-CM | POA: Insufficient documentation

## 2023-03-16 DIAGNOSIS — G43909 Migraine, unspecified, not intractable, without status migrainosus: Secondary | ICD-10-CM | POA: Insufficient documentation

## 2023-03-16 DIAGNOSIS — E782 Mixed hyperlipidemia: Secondary | ICD-10-CM | POA: Diagnosis not present

## 2023-03-16 HISTORY — PX: CATARACT EXTRACTION W/PHACO: SHX586

## 2023-03-16 HISTORY — DX: Nonrheumatic aortic (valve) insufficiency: I35.1

## 2023-03-16 SURGERY — PHACOEMULSIFICATION, CATARACT, WITH IOL INSERTION
Anesthesia: Monitor Anesthesia Care | Laterality: Right

## 2023-03-16 MED ORDER — FENTANYL CITRATE (PF) 100 MCG/2ML IJ SOLN
INTRAMUSCULAR | Status: DC | PRN
  Administered 2023-03-16 (×2): 50 ug via INTRAVENOUS

## 2023-03-16 MED ORDER — ARMC OPHTHALMIC DILATING DROPS
OPHTHALMIC | Status: AC
Start: 2023-03-16 — End: ?
  Filled 2023-03-16: qty 0.5

## 2023-03-16 MED ORDER — ARMC OPHTHALMIC DILATING DROPS
1.0000 | OPHTHALMIC | Status: DC | PRN
  Administered 2023-03-16 (×3): 1 via OPHTHALMIC

## 2023-03-16 MED ORDER — TETRACAINE HCL 0.5 % OP SOLN
1.0000 [drp] | OPHTHALMIC | Status: DC | PRN
  Administered 2023-03-16 (×3): 1 [drp] via OPHTHALMIC

## 2023-03-16 MED ORDER — FENTANYL CITRATE (PF) 100 MCG/2ML IJ SOLN
INTRAMUSCULAR | Status: AC
  Filled 2023-03-16: qty 2

## 2023-03-16 MED ORDER — SIGHTPATH DOSE#1 NA HYALUR & NA CHOND-NA HYALUR IO KIT
PACK | INTRAOCULAR | Status: DC | PRN
  Administered 2023-03-16: 1 via OPHTHALMIC

## 2023-03-16 MED ORDER — MIDAZOLAM HCL 2 MG/2ML IJ SOLN
INTRAMUSCULAR | Status: DC | PRN
  Administered 2023-03-16 (×2): 1 mg via INTRAVENOUS

## 2023-03-16 MED ORDER — SIGHTPATH DOSE#1 BSS IO SOLN
INTRAOCULAR | Status: DC | PRN
  Administered 2023-03-16: 2 mL

## 2023-03-16 MED ORDER — BRIMONIDINE TARTRATE-TIMOLOL 0.2-0.5 % OP SOLN
OPHTHALMIC | Status: DC | PRN
  Administered 2023-03-16: 1 [drp] via OPHTHALMIC

## 2023-03-16 MED ORDER — MIDAZOLAM HCL 2 MG/2ML IJ SOLN
INTRAMUSCULAR | Status: AC
Start: 2023-03-16 — End: ?
  Filled 2023-03-16: qty 2

## 2023-03-16 MED ORDER — SIGHTPATH DOSE#1 BSS IO SOLN
INTRAOCULAR | Status: DC | PRN
  Administered 2023-03-16: 65 mL via OPHTHALMIC

## 2023-03-16 MED ORDER — SIGHTPATH DOSE#1 BSS IO SOLN
INTRAOCULAR | Status: DC | PRN
  Administered 2023-03-16: 15 mL via INTRAOCULAR

## 2023-03-16 MED ORDER — TETRACAINE HCL 0.5 % OP SOLN
OPHTHALMIC | Status: AC
  Filled 2023-03-16: qty 4

## 2023-03-16 MED ORDER — MOXIFLOXACIN HCL 0.5 % OP SOLN
OPHTHALMIC | Status: DC | PRN
  Administered 2023-03-16: .2 mL via OPHTHALMIC

## 2023-03-16 SURGICAL SUPPLY — 9 items
CATARACT SUITE SIGHTPATH (MISCELLANEOUS) ×1
FEE CATARACT SUITE SIGHTPATH (MISCELLANEOUS) ×1 IMPLANT
GLOVE SRG 8 PF TXTR STRL LF DI (GLOVE) ×1 IMPLANT
GLOVE SURG ENC TEXT LTX SZ7.5 (GLOVE) ×1 IMPLANT
LENS CLAREON VIVITY TORIC 175 ×1 IMPLANT
LENS IOL CLRN VT TRC 3 17.5 IMPLANT
NDL FILTER BLUNT 18X1 1/2 (NEEDLE) ×1 IMPLANT
NEEDLE FILTER BLUNT 18X1 1/2 (NEEDLE) ×1
SYR 3ML LL SCALE MARK (SYRINGE) ×1 IMPLANT

## 2023-03-16 NOTE — Transfer of Care (Signed)
 Immediate Anesthesia Transfer of Care Note  Patient: Lynn Tapia  Procedure(s) Performed: CATARACT EXTRACTION PHACO AND INTRAOCULAR LENS PLACEMENT (IOC) RIGHT  CLAREON VIVITY TORIC 2.62 00:20.0 (Right)  Patient Location: PACU  Anesthesia Type: MAC  Level of Consciousness: awake, alert  and patient cooperative  Airway and Oxygen Therapy: Patient Spontanous Breathing and Patient connected to supplemental oxygen  Post-op Assessment: Post-op Vital signs reviewed, Patient's Cardiovascular Status Stable, Respiratory Function Stable, Patent Airway and No signs of Nausea or vomiting  Post-op Vital Signs: Reviewed and stable  Complications: No notable events documented.

## 2023-03-16 NOTE — H&P (Signed)
 [facility]   Primary Care Physician:  Justus Leita DEL, MD Ophthalmologist: Dr. Dene Etienne  Pre-Procedure History & Physical: HPI:  Lynn Tapia is a 68 y.o. female here for ophthalmic surgery.   Past Medical History:  Diagnosis Date   Adenomatous polyp of colon    Ankle swelling    Arthritis    CHF (congestive heart failure) (HCC)    Gout    Heart murmur    Hypertension    Hypokalemia    Migraine    Non-alcoholic fatty liver disease    Vitamin D deficiency, unspecified     Past Surgical History:  Procedure Laterality Date   ABDOMINAL HYSTERECTOMY  03/2008   ANUS SURGERY  12/2018   CATARACT EXTRACTION W/PHACO Left 03/02/2023   Procedure: CATARACT EXTRACTION PHACO AND INTRAOCULAR LENS PLACEMENT (IOC) LEFT CLAREON VIVITY 3.52 00:19.1;  Surgeon: Etienne Dene, MD;  Location: [facility];  Service: Ophthalmology;  Laterality: Left;   CHOLECYSTECTOMY  2002   at [facility] ARTHROSCOPY WITH SUBACROMIAL DECOMPRESSION, ROTATOR CUFF REPAIR AND BICEP TENDON REPAIR Right 02/11/2022   Procedure: SHOULDER ARTHROSCOPY WITH DEBRIDEMENT, DECOMPRESSION, ROTATOR CUFF REPAIR AND BIPEPS TENODESIS.;  Surgeon: Edie Norleen PARAS, MD;  Location: [facility] ORS;  Service: Orthopedics;  Laterality: Right;    Prior to Admission medications   Medication Sig Start Date End Date Taking? Authorizing Provider  acetaminophen  (TYLENOL ) 500 MG tablet Take 1,000 mg by mouth every 6 (six) hours as needed.   Yes [provider]  allopurinol  (ZYLOPRIM ) 100 MG tablet Take 2 tablets (200 mg total) by mouth daily. 01/31/23  Yes Justus Leita DEL, MD  ascorbic acid (VITAMIN C) 1000 MG tablet Take 1,000 mg by mouth daily.   Yes [provider]  chlorthalidone (HYGROTON) 25 MG tablet Take 25 mg by mouth daily.   Yes [provider]  cholecalciferol (VITAMIN D3) 25 MCG (1000 UNIT) tablet Take 1,000 Units by mouth daily.   Yes [provider]  colchicine  0.6  MG tablet TAKE 1 TABLET BY MOUTH 2 TIMES DAILY. 09/09/22  Yes Justus Leita DEL, MD  cyanocobalamin  (VITAMIN B12) 1000 MCG tablet Take 1,000 mcg by mouth daily.   Yes [provider]  docusate sodium (COLACE) 100 MG capsule Take 100 mg by mouth 2 (two) times daily.   Yes [provider]  fluocinonide cream (LIDEX) 0.05 % Apply 1 Application topically 2 (two) times daily.   Yes [provider]  fluticasone  (FLONASE ) 50 MCG/ACT nasal spray INSTILL 2 SPRAYS BY EACH NARE ROUTE DAILY. 11/14/17  Yes [provider]  ibuprofen (ADVIL) 200 MG tablet Take 200 mg by mouth every 6 (six) hours as needed.   Yes [provider]  losartan  (COZAAR ) 25 MG tablet Take 1 tablet (25 mg total) by mouth daily. 10/27/22  Yes Justus Leita DEL, MD  magnesium oxide (MAG-OX) 400 (240 Mg) MG tablet Take 400 mg by mouth daily.   Yes [provider]  polyethylene glycol (MIRALAX / GLYCOLAX) 17 g packet Take 17 g by mouth daily.   Yes [provider]  potassium chloride (KLOR-CON M10) 10 MEQ tablet Take 20 mEq by mouth daily.   Yes [provider]  SUMAtriptan  (IMITREX ) 50 MG tablet Take 1 tablet (50 mg total) by mouth every 2 (two) hours as needed for migraine. May repeat in 2 hours if headache persists or recurs. 10/08/22  Yes Berglund, Laura H, MD  triamcinolone cream (KENALOG) 0.1 % Apply 1 Application topically  2 (two) times daily.   Yes [provider]  acyclovir ointment (ZOVIRAX) 5 % Apply 1 Application topically every 3 (three) hours.    [provider]  hydrocortisone  (ANUSOL -HC) 2.5 % rectal cream Place 1 Application rectally 2 (two) times daily. Patient not taking: Reported on 02/15/2023 07/27/22   Justus Leita DEL, MD  rosuvastatin  (CRESTOR ) 5 MG tablet Take 1 tablet (5 mg total) by mouth daily. Patient not taking: Reported on 02/15/2023 10/28/22   Justus Leita DEL, MD    Allergies as of 02/14/2023 - Review Complete 01/28/2023   Allergen Reaction Noted   Penicillins  02/03/2022   Sulfa antibiotics Rash 02/03/2022   Telmisartan Other (See Comments) 02/11/2022    Family History  Problem Relation Age of Onset   Breast cancer Mother 98   CAD Mother 33       MI during her breast cancer   Diabetes Mother    Cancer Mother 39       Breast, mastectomy, ultimately metastatic and caused her death   Cancer Father 24       Colon   Hypertension Father    Allergies Father    COPD Sister 51       Smoker   Asthma Sister    Diabetes Sister    Stroke Maternal Grandmother        Cerebral hemorrhage   CAD Maternal Grandfather 61   Emphysema Paternal Grandmother    Cancer Brother 19       Lung    Social History   Socioeconomic History   Marital status: Married    Spouse name: Not on file   Number of children: Not on file   Years of education: Not on file   Highest education level: GED or equivalent  Occupational History   Not on file  Tobacco Use   Smoking status: Never   Smokeless tobacco: Never  Vaping Use   Vaping status: Never Used  Substance and Sexual Activity   Alcohol use: Never   Drug use: Never   Sexual activity: Yes    Birth control/protection: None  Other Topics Concern   Not on file  Social History Narrative   Not on file   Social Drivers of Health   Financial Resource Strain: Low Risk  (01/24/2023)   Overall Financial Resource Strain (CARDIA)    Difficulty of Paying Living Expenses: Not hard at all  Food Insecurity: No Food Insecurity (01/24/2023)   Hunger Vital Sign    Worried About Running Out of Food in the Last Year: Never true    Ran Out of Food in the Last Year: Never true  Transportation Needs: No Transportation Needs (01/24/2023)   PRAPARE - Administrator, Civil Service (Medical): No    Lack of Transportation (Non-Medical): No  Physical Activity: Unknown (05/13/2022)   Exercise Vital Sign    Days of Exercise per Week: 0 days    Minutes of Exercise per  Session: Not on file  Stress: No Stress Concern Present (01/24/2023)   [employer] of Occupational Health - Occupational Stress Questionnaire    Feeling of Stress : Not at all  Social Connections: Socially Integrated (01/24/2023)   Social Connection and Isolation Panel [NHANES]    Frequency of Communication with Friends and Family: More than three times a week    Frequency of Social Gatherings with Friends and Family: More than three times a week    Attends Religious Services: More than 4 times per year  Active Member of Clubs or Organizations: Yes    Attends Banker Meetings: Not on file    Marital Status: Married  Intimate Partner Violence: Not At Risk (05/21/2022)   Humiliation, Afraid, Rape, and Kick questionnaire    Fear of Current or Ex-Partner: No    Emotionally Abused: No    Physically Abused: No    Sexually Abused: No    Review of Systems: See HPI, otherwise negative ROS  Physical Exam: BP (!) 142/58   Pulse 72   Temp 98.1 F (36.7 C) (Temporal)   Resp 18   Ht 5' 6 (1.676 m)   Wt 99.3 kg   SpO2 96%   BMI 35.35 kg/m  General:   Alert,  pleasant and cooperative in NAD Head:  Normocephalic and atraumatic. Lungs:  Clear to auscultation.    Heart:  Regular rate and rhythm.  Impression/Plan: Lynn Tapia is here for ophthalmic surgery.  Risks, benefits, limitations, and alternatives regarding ophthalmic surgery have been reviewed with the patient.  Questions have been answered.  All parties agreeable.   MITTIE GASKIN, MD  03/16/2023, 11:39 AM .

## 2023-03-16 NOTE — Anesthesia Preprocedure Evaluation (Addendum)
 Anesthesia Evaluation  Patient identified by MRN, date of birth, ID band Patient awake    Reviewed: Allergy & Precautions, H&P , NPO status , Patient's Chart, lab work & pertinent test results  Airway Mallampati: II  TM Distance: <3 FB Neck ROM: Full    Dental no notable dental hx. (+) Missing   Pulmonary neg pulmonary ROS   Pulmonary exam normal breath sounds clear to auscultation       Cardiovascular hypertension, +CHF  Normal cardiovascular exam+ Valvular Problems/Murmurs  Rhythm:Regular Rate:Normal  01-12-22 Summary   1. The left ventricle is normal in size with normal wall thickness.    2. The left ventricular systolic function is normal, LVEF is visually  estimated at > 55%.    3. There is mild aortic regurgitation.    4. The right ventricle is normal in size, with normal systolic function.     Neuro/Psych  Headaches negative neurological ROS  negative psych ROS   GI/Hepatic negative GI ROS, Neg liver ROS,GERD  ,,  Endo/Other  negative endocrine ROS    Renal/GU negative Renal ROS  negative genitourinary   Musculoskeletal negative musculoskeletal ROS (+) Arthritis ,    Abdominal   Peds negative pediatric ROS (+)  Hematology negative hematology ROS (+)   Anesthesia Other Findings CHF (congestive heart failure) (HCC) Ankle swelling Arthritis  Gout Heart murmur  Adenomatous polyp of colon Hypertension  Migraine Non-alcoholic fatty liver disease  Vitamin D deficiency, unspecified Hypokalemia   Mild aortic regurg  Reproductive/Obstetrics negative OB ROS                              Anesthesia Physical Anesthesia Plan  ASA: 3  Anesthesia Plan: MAC   Post-op Pain Management:    Induction: Intravenous  PONV Risk Score and Plan:   Airway Management Planned: Natural Airway and Nasal Cannula  Additional Equipment:   Intra-op Plan:   Post-operative Plan:   Informed  Consent: I have reviewed the patients History and Physical, chart, labs and discussed the procedure including the risks, benefits and alternatives for the proposed anesthesia with the patient or authorized representative who has indicated his/her understanding and acceptance.     Dental Advisory Given  Plan Discussed with: Anesthesiologist, CRNA and Surgeon  Anesthesia Plan Comments: (Patient consented for risks of anesthesia including but not limited to:  - adverse reactions to medications - damage to eyes, teeth, lips or other oral mucosa - nerve damage due to positioning  - sore throat or hoarseness - Damage to heart, brain, nerves, lungs, other parts of body or loss of life  Patient voiced understanding and assent.)         Anesthesia Quick Evaluation

## 2023-03-16 NOTE — Op Note (Signed)
 LOCATION:  [facility]   PREOPERATIVE DIAGNOSIS:  Nuclear sclerotic cataract of the right eye.  H25.11   POSTOPERATIVE DIAGNOSIS:  Nuclear sclerotic cataract of the right eye.   PROCEDURE:  Phacoemulsification with Toric posterior chamber intraocular lens placement of the right eye.  Ultrasound time: Procedure(s): CATARACT EXTRACTION PHACO AND INTRAOCULAR LENS PLACEMENT (IOC) RIGHT  CLAREON VIVITY TORIC 2.62 00:20.0 (Right)  LENS:   Implant Name Type Inv. Item Serial No. Manufacturer Lot No. LRB No. Used Action  CLAREON VIVITY TORIC [id number] 17.5   [id number]   Right 1 Implanted     Vivity Toric intraocular lens with 1.5 diopters of cylindrical power with axis orientation at 77 degrees.   SURGEON:  Dene FABIENE Etienne, MD   ANESTHESIA: Topical with tetracaine  drops and 2% Xylocaine  jelly, augmented with 1% preservative-free intracameral lidocaine . .   COMPLICATIONS:  None.   DESCRIPTION OF PROCEDURE:  The patient was identified in the holding room and transported to the operating suite and placed in the supine position under the operating microscope.  The right eye was identified as the operative eye, and it was prepped and draped in the usual sterile ophthalmic fashion.    A clear-corneal paracentesis incision was made at the 12:00 position.  0.5 ml of preservative-free 1% lidocaine  was injected into the anterior chamber. The anterior chamber was filled with Viscoat.  A 2.4 millimeter near clear corneal incision was then made at the 9:00 position.  A cystotome and capsulorrhexis forceps were then used to make a curvilinear capsulorrhexis.  Hydrodissection and hydrodelineation were then performed using balanced salt solution.   Phacoemulsification was then used in stop and chop fashion to remove the lens, nucleus and epinucleus.  The remaining cortex was aspirated using the irrigation and aspiration handpiece.  Provisc viscoelastic was then placed into the capsular bag to distend  it for lens placement.  The Verion digital marker was used to align the implant at the intended axis.   A Toric lens was then injected into the capsular bag.  It was rotated clockwise until the axis marks on the lens were approximately 15 degrees in the counterclockwise direction to the intended alignment.  The viscoelastic was aspirated from the eye using the irrigation aspiration handpiece.  Then, a Koch spatula through the sideport incision was used to rotate the lens in a clockwise direction until the axis markings of the intraocular lens were lined up with the Verion alignment.  Balanced salt solution was then used to hydrate the wounds. Vigamox  0.2 ml of a 1mg  per ml solution was injected into the anterior chamber for a dose of 0.2 mg of intracameral antibiotic at the completion of the case.    The eye was noted to have a physiologic pressure and there was no wound leak noted.   Timolol  and Brimonidine  drops were applied to the eye.  The patient was taken to the recovery room in stable condition having had no complications of anesthesia or surgery.  Kailash Hinze 03/16/2023, 12:52 PM

## 2023-03-16 NOTE — Anesthesia Postprocedure Evaluation (Signed)
 Anesthesia Post Note  Patient: Lynn Tapia  Procedure(s) Performed: CATARACT EXTRACTION PHACO AND INTRAOCULAR LENS PLACEMENT (IOC) RIGHT  CLAREON VIVITY TORIC 2.62 00:20.0 (Right)  Patient location during evaluation: PACU Anesthesia Type: MAC Level of consciousness: awake and alert Pain management: pain level controlled Vital Signs Assessment: post-procedure vital signs reviewed and stable Respiratory status: spontaneous breathing, nonlabored ventilation, respiratory function stable and patient connected to nasal cannula oxygen Cardiovascular status: stable and blood pressure returned to baseline Postop Assessment: no apparent nausea or vomiting Anesthetic complications: no   No notable events documented.   Last Vitals:  Vitals:   03/16/23 1254 03/16/23 1258  BP: 112/73   Pulse: 71 74  Resp:  (!) 21  Temp: (!) 36.4 C   SpO2: 96% 95%    Last Pain:  Vitals:   03/16/23 1258  TempSrc:   PainSc: 0-No pain                 Lanay Zinda C Jurrell Royster

## 2023-03-17 ENCOUNTER — Encounter: Payer: Self-pay | Admitting: Ophthalmology

## 2023-03-19 IMAGING — MR MR CERVICAL SPINE W/O CM
4 of 5 series · 28 of 48 positions shown · non-contrast
Comparison: None Available.

CLINICAL DATA: Cervical spondylosis without myelopathy;
technologist note states neck and right shoulder pain

EXAM:
MRI CERVICAL SPINE WITHOUT CONTRAST
TECHNIQUE: Multiplanar, multisequence MR imaging of the cervical spine was
performed. No intravenous contrast was administered.

[Series 6: T1 · sagittal · 3.0mm · 0.66mm/px · 7 of 15 slices shown]
[im 1/15]
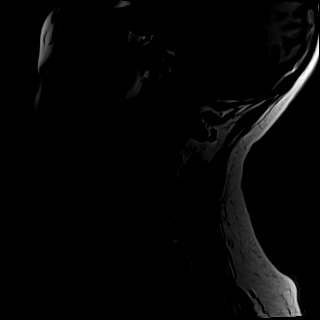
[im 3/15]
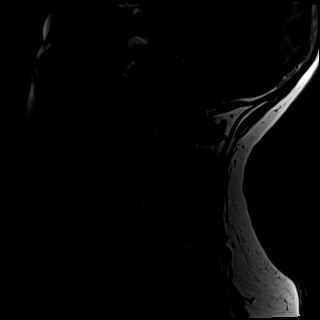
[im 5/15]
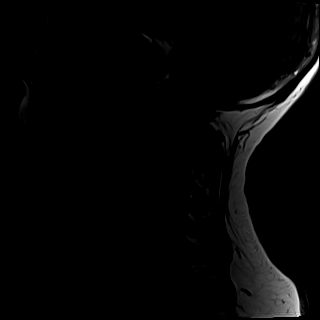
[im 8/15]
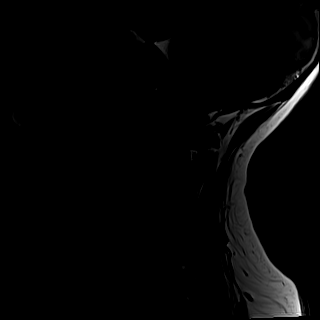
[im 10/15]
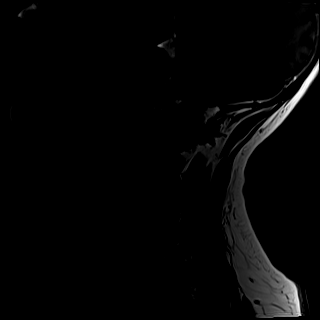
[im 12/15]
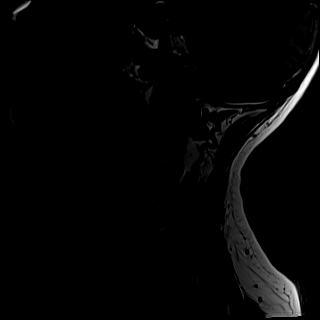
[im 15/15]
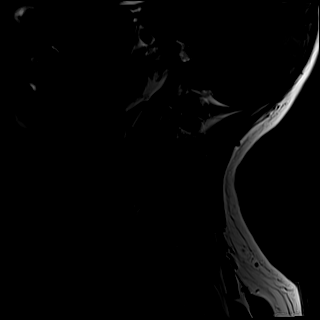

[Series 7: T2 · sagittal · 3.0mm · 0.55mm/px · 7 of 15 slices shown (1 of 2)]
[im 1/15]
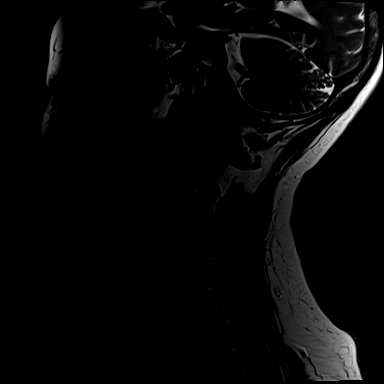
[im 3/15]
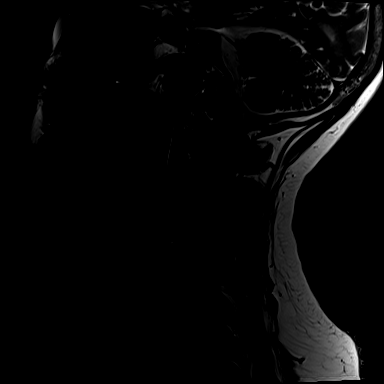
[im 5/15]
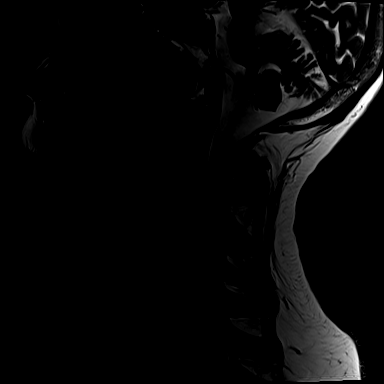
[im 8/15]
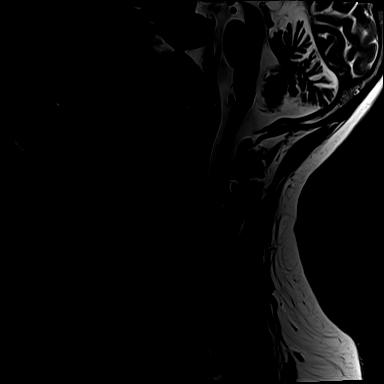
[im 10/15]
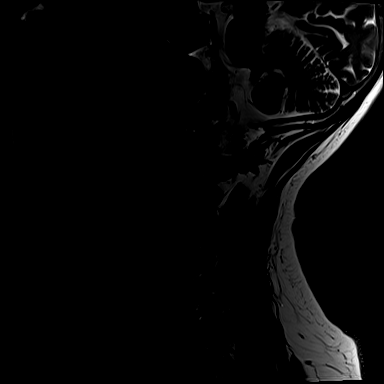
[im 12/15]
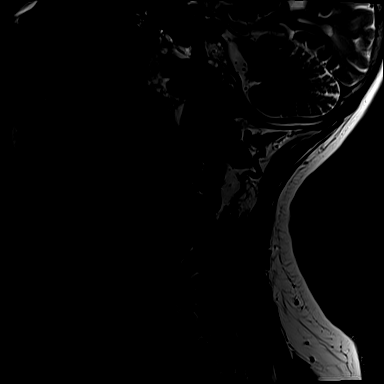
[im 15/15]
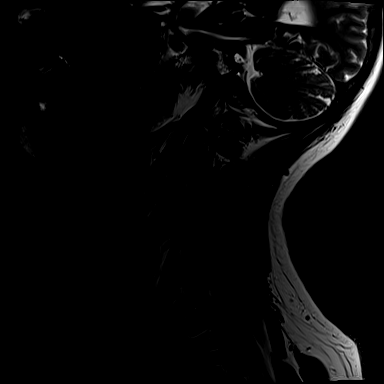

[Series 8: STIR · sagittal · 3.0mm · 0.33mm/px · 6 of 15 slices shown]
[im 1/15]
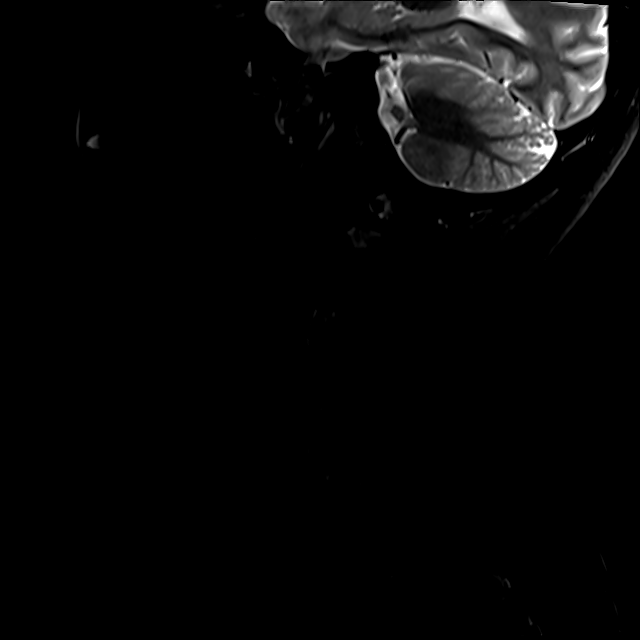
[im 3/15]
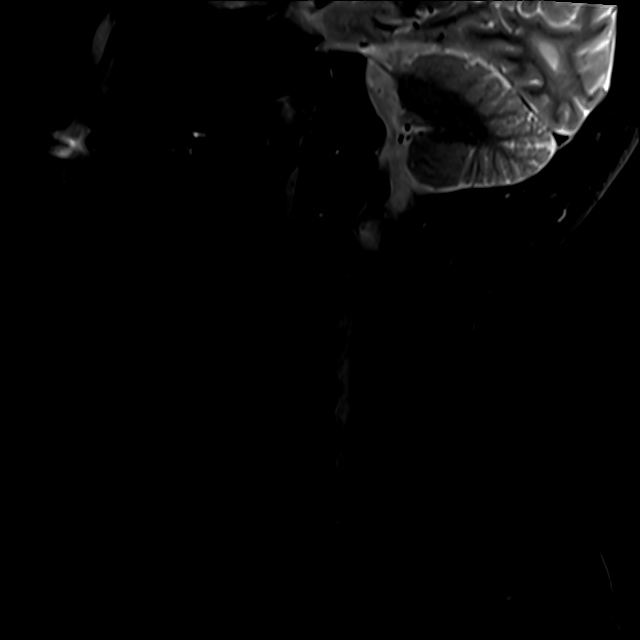
[im 6/15]
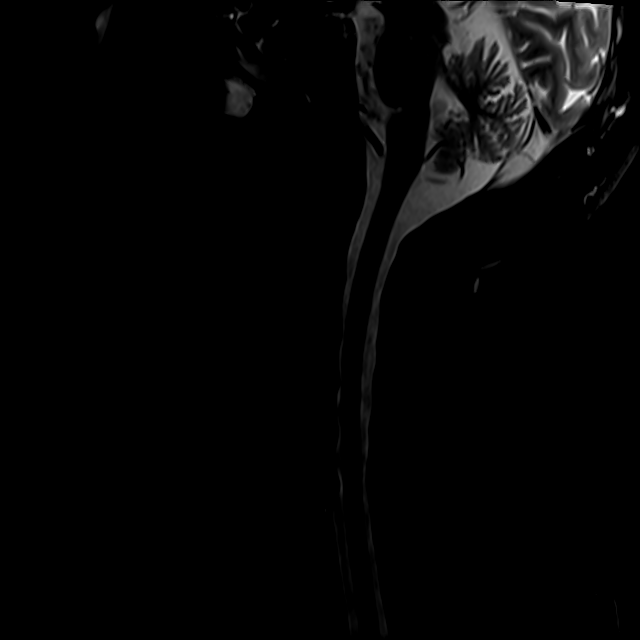
[im 9/15]
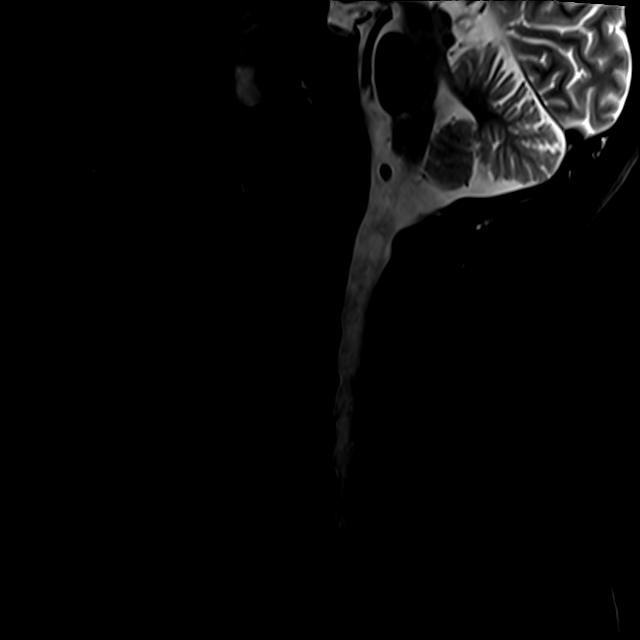
[im 12/15]
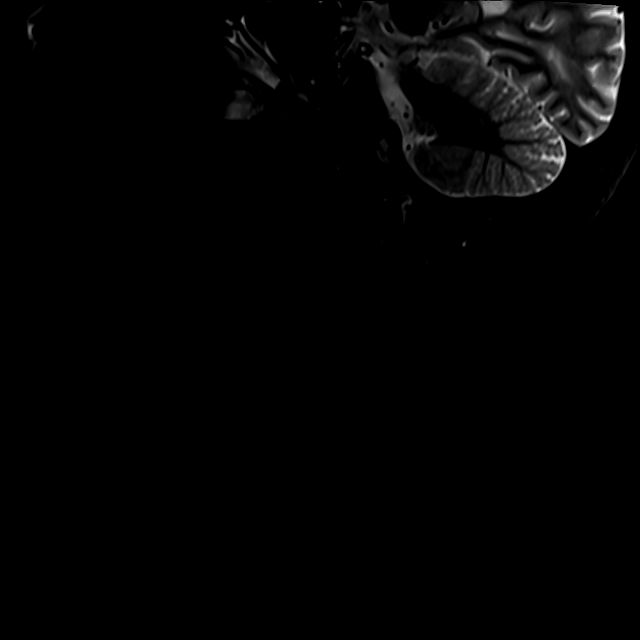
[im 15/15]
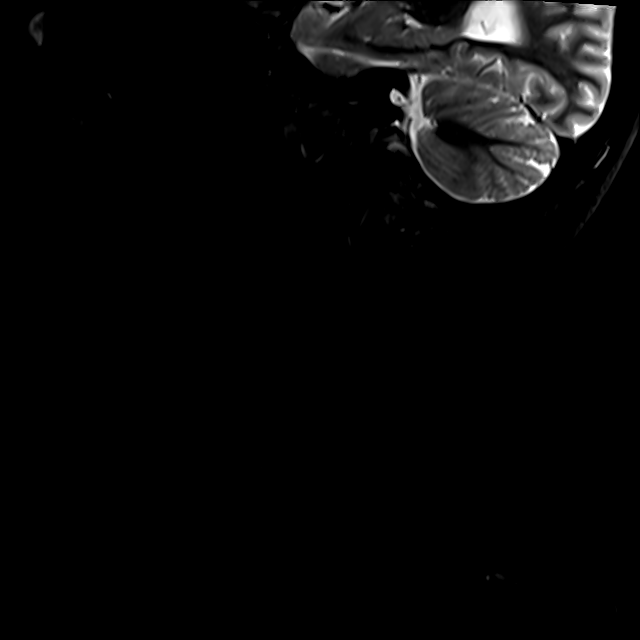

[Series 9: T2 · axial · 3.0mm · 0.50mm/px · z∈[-86,+14]mm · 8 of 33 slices shown (2 of 2)]
[im 1/33]
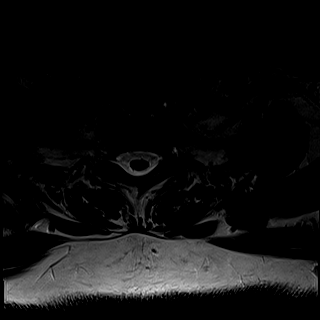
[im 5/33]
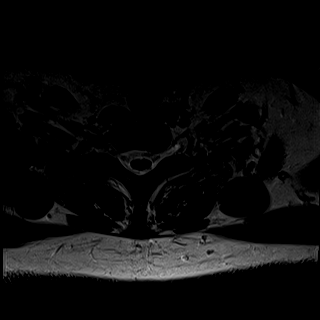
[im 10/33]
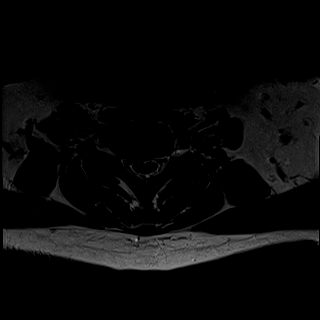
[im 15/33]
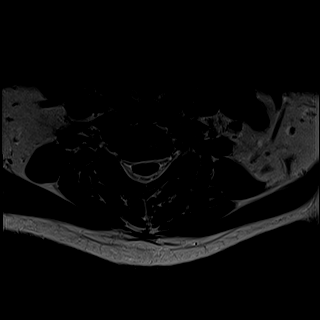
[im 18/33]
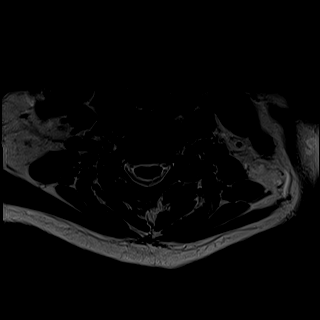
[im 23/33]
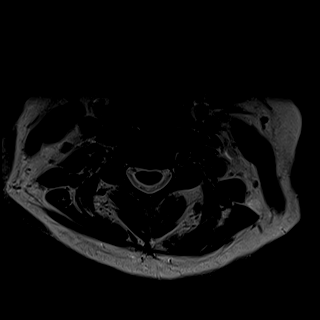
[im 28/33]
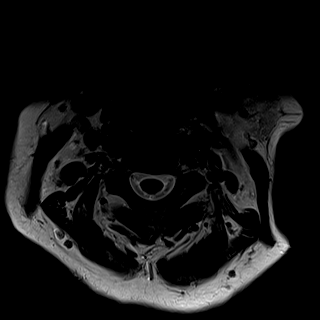
[im 33/33]
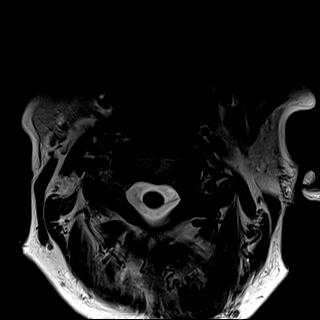

[28 of 48 positions shown; findings below may reference images not displayed]

FINDINGS: Alignment: No significant listhesis.

Vertebrae: Likely degenerative endplate irregularity at C6-C7.
Associated marrow changes are present without substantial edema.

Cord: Normal caliber and signal.

Posterior Fossa, vertebral arteries, paraspinal tissues:
Unremarkable.

Disc levels:

C2-C3:  No canal or foraminal stenosis.

C3-C4:  Trace central protrusion.  No canal or foraminal stenosis.

C4-C5: Trace disc bulge. Mild uncovertebral hypertrophy. No canal or
right foraminal stenosis. Mild left foraminal stenosis.

C5-C6: Disc bulge with endplate osteophytes. Right greater than left
uncovertebral hypertrophy. No canal stenosis. Moderate right
foraminal stenosis. No left foraminal stenosis.

C6-C7: Disc bulge with superimposed trace central protrusion and
endplate osteophytes. Right uncovertebral hypertrophy. No canal
stenosis. Moderate right foraminal stenosis. No left foraminal
stenosis.

C7-T1:  No canal or foraminal stenosis.
IMPRESSION: Multilevel degenerative changes as detailed above. No significant
canal narrowing. Right foraminal narrowing at C5-C6 and C6-C7.

## 2023-04-26 ENCOUNTER — Other Ambulatory Visit: Payer: Self-pay | Admitting: Internal Medicine

## 2023-04-26 DIAGNOSIS — I10 Essential (primary) hypertension: Secondary | ICD-10-CM

## 2023-04-27 NOTE — Telephone Encounter (Signed)
 Requested Prescriptions  Pending Prescriptions Disp Refills   losartan (COZAAR) 25 MG tablet [Pharmacy Med Name: LOSARTAN POTASSIUM 25 MG TAB] 90 tablet 0    Sig: TAKE 1 TABLET (25 MG TOTAL) BY MOUTH DAILY.     Cardiovascular:  Angiotensin Receptor Blockers Failed - 04/27/2023  9:57 AM      Failed - Cr in normal range and within 180 days    Creatinine, Ser  Date Value Ref Range Status  10/27/2022 0.96 0.57 - 1.00 mg/dL Final         Failed - K in normal range and within 180 days    Potassium  Date Value Ref Range Status  10/27/2022 3.6 3.5 - 5.2 mmol/L Final         Passed - Patient is not pregnant      Passed - Last BP in normal range    BP Readings from Last 1 Encounters:  03/16/23 112/73         Passed - Valid encounter within last 6 months    Recent Outpatient Visits           2 months ago Primary hypertension   [place] [facility] at [facility], Nyoka Cowden, MD   6 months ago Annual physical exam   [facility] at [facility], Nyoka Cowden, MD   6 months ago Gouty arthritis   [facility] at [facility], Nyoka Cowden, MD   7 months ago Acute cough   [place] [facility] at [facility], Nyoka Cowden, MD   9 months ago Gouty arthritis of left great toe   [facility] at [facility], Nyoka Cowden, MD       Future Appointments             In 2 weeks Judithann Graves, Nyoka Cowden, MD [facility] at [facility], [facility]   In 6 months Judithann Graves, Nyoka Cowden, MD [facility] at [facility], [facility]

## 2023-05-03 ENCOUNTER — Other Ambulatory Visit: Payer: Self-pay | Admitting: Internal Medicine

## 2023-05-04 ENCOUNTER — Other Ambulatory Visit: Payer: Self-pay | Admitting: Internal Medicine

## 2023-05-04 MED ORDER — FLUTICASONE PROPIONATE 50 MCG/ACT NA SUSP: 2.0000 | Freq: Every day | NASAL | 3 refills | Status: AC

## 2023-05-04 NOTE — Telephone Encounter (Signed)
 Copied from CRM [id number]. Topic: Clinical - Medication Refill >> May 04, 2023  9:08 AM Eusebio Me wrote: Most Recent Primary Care Visit:  Provider: Reubin Milan  Department: [facility]  Visit Type: OFFICE VISIT  Date: 01/28/2023  Medication: fluticasone (FLONASE) 50 MCG/ACT nasal spray, potassium chloride (KLOR-CON M10) 10 MEQ tablet,chlorthalidone (HYGROTON) 25 MG tablet  Has the patient contacted their pharmacy? Yes, pharmacy advised patient to call provider (Agent: If no, request that the patient contact the pharmacy for the refill. If patient does not wish to contact the pharmacy document the reason why and proceed with request.) (Agent: If yes, when and what did the pharmacy advise?)  Is this the correct pharmacy for this prescription? Yes If no, delete pharmacy and type the correct one.  This is the patient's preferred pharmacy:  [id number] - [place], [place] - [address] [place] [address] Phone: [phone] Fax: [phone]   Has the prescription been filled recently? No  Is the patient out of the medication? Yes  Has the patient been seen for an appointment in the last year OR does the patient have an upcoming appointment? Yes  Can we respond through MyChart? Yes  Agent: Please be advised that Rx refills may take up to 3 business days. We ask that you follow-up with your pharmacy.

## 2023-05-16 ENCOUNTER — Other Ambulatory Visit: Payer: Self-pay | Admitting: Internal Medicine

## 2023-05-16 NOTE — Progress Notes (Unsigned)
 Date:  05/16/2023   Name:  Lynn Tapia   DOB:  [birth year]   MRN:  [id number]   Chief Complaint: No chief complaint on file.  HPI  Review of Systems   Lab Results  Component Value Date   NA 140 10/27/2022   K 3.6 10/27/2022   CO2 26 10/27/2022   GLUCOSE 104 (H) 10/27/2022   BUN 12 10/27/2022   CREATININE 0.96 10/27/2022   CALCIUM 9.2 10/27/2022   EGFR 66 10/27/2022   GFRNONAA >60 10/13/2022   Lab Results  Component Value Date   CHOL 216 (H) 10/27/2022   HDL 34 (L) 10/27/2022   LDLCALC 135 (H) 10/27/2022   TRIG 260 (H) 10/27/2022   CHOLHDL 6.4 (H) 10/27/2022   Lab Results  Component Value Date   TSH 2.680 10/27/2022   Lab Results  Component Value Date   HGBA1C 6.3 (H) 10/27/2022   Lab Results  Component Value Date   WBC 7.3 10/27/2022   HGB 13.1 10/27/2022   HCT 39.6 10/27/2022   MCV 94 10/27/2022   PLT 245 10/27/2022   Lab Results  Component Value Date   ALT 49 (H) 10/27/2022   AST 46 (H) 10/27/2022   ALKPHOS 53 10/27/2022   BILITOT 0.3 10/27/2022   No results found for: "25OHVITD2", "25OHVITD3", "VD25OH"   Patient Active Problem List   Diagnosis Date Noted   Obesity, morbid (HCC) 01/28/2023   Prediabetes 10/27/2022   B12 deficiency 06/07/2022   Gouty arthritis of left great toe 05/30/2022   Gouty arthritis 05/21/2022   Gastroesophageal reflux disease 05/21/2022   BMI 35.0-35.9,adult 05/21/2022   Gastritis 05/13/2022   Superior labrum anterior-to-posterior (SLAP) tear of right shoulder 02/11/2022   Cervical spondylosis 05/22/2021   Family history of breast cancer in first degree relative 09/16/2020   Migraine with aura and without status migrainosus, not intractable 09/16/2020   Mixed hyperlipidemia 03/09/2018   Asymptomatic microscopic hematuria 04/29/2016   Chronic idiopathic constipation 04/20/2016   Internal hemorrhoids with complication 09/17/2015   Rectocele 09/17/2015   History of adenomatous polyp of colon 03/06/2015    Hypertensive heart disease without CHF 11/28/2014   Primary hypertension 11/26/2012    Allergies  Allergen Reactions   Penicillins     Reports this happened at 57 months old    Sulfa Antibiotics Rash   Telmisartan Other (See Comments)    Patient reports nightmares     Past Surgical History:  Procedure Laterality Date   ABDOMINAL HYSTERECTOMY  03/2008   ANUS SURGERY  12/2018   CATARACT EXTRACTION W/PHACO Left 03/02/2023   Procedure: CATARACT EXTRACTION PHACO AND INTRAOCULAR LENS PLACEMENT (IOC) LEFT CLAREON VIVITY 3.52 00:19.1;  Surgeon: Lockie Mola, MD;  Location: [facility];  Service: Ophthalmology;  Laterality: Left;   CATARACT EXTRACTION W/PHACO Right 03/16/2023   Procedure: CATARACT EXTRACTION PHACO AND INTRAOCULAR LENS PLACEMENT (IOC) RIGHT  CLAREON VIVITY TORIC 2.62 00:20.0;  Surgeon: Lockie Mola, MD;  Location: [facility];  Service: Ophthalmology;  Laterality: Right;   CHOLECYSTECTOMY  2002   at [facility] ARTHROSCOPY WITH SUBACROMIAL DECOMPRESSION, ROTATOR CUFF REPAIR AND BICEP TENDON REPAIR Right 02/11/2022   Procedure: SHOULDER ARTHROSCOPY WITH DEBRIDEMENT, DECOMPRESSION, ROTATOR CUFF REPAIR AND BIPEPS TENODESIS.;  Surgeon: Christena Flake, MD;  Location: [facility];  Service: Orthopedics;  Laterality: Right;    Social History   Tobacco Use   Smoking status: Never   Smokeless tobacco: Never  Vaping Use   Vaping status: Never Used  Substance Use Topics   Alcohol use: Never   Drug use: Never     Medication list has been reviewed and updated.  No outpatient medications have been marked as taking for the 05/16/23 encounter (Orders Only) with Reubin Milan, MD.       10/27/2022   10:11 AM 10/13/2022    1:42 PM 09/15/2022   11:21 AM 07/27/2022    9:37 AM  GAD 7 : Generalized Anxiety Score  Nervous, Anxious, on Edge 0 0 0 0  Control/stop worrying 0 0 0 0  Worry too much - different things 0 0 0 0  Trouble relaxing 0 0 0 0  Restless  0 0 0 0  Easily annoyed or irritable 0 0 0 0  Afraid - awful might happen 0 0 0 0  Total GAD 7 Score 0 0 0 0  Anxiety Difficulty Not difficult at all Not difficult at all Not difficult at all Not difficult at all       10/27/2022   10:11 AM 10/13/2022    1:42 PM 09/15/2022   11:21 AM  Depression screen PHQ 2/9  Decreased Interest 0 0 0  Down, Depressed, Hopeless 0 0 0  PHQ - 2 Score 0 0 0  Altered sleeping 0 0 0  Tired, decreased energy 0 0 0  Change in appetite 0 0 0  Feeling bad or failure about yourself  0 0 0  Trouble concentrating 0 0 0  Moving slowly or fidgety/restless 0 0 0  Suicidal thoughts 0 0 0  PHQ-9 Score 0 0 0  Difficult doing work/chores Not difficult at all Not difficult at all Not difficult at all    BP Readings from Last 3 Encounters:  03/16/23 112/73  03/02/23 101/74  01/28/23 128/74    Physical Exam  Wt Readings from Last 3 Encounters:  03/16/23 219 lb (99.3 kg)  03/02/23 219 lb 14.4 oz (99.7 kg)  01/28/23 222 lb (100.7 kg)    There were no vitals taken for this visit.  Assessment and Plan:  Problem List Items Addressed This Visit   None   No follow-ups on file.    Reubin Milan, MD [facility] [place]

## 2023-05-17 ENCOUNTER — Ambulatory Visit (INDEPENDENT_AMBULATORY_CARE_PROVIDER_SITE_OTHER): Payer: Self-pay | Admitting: Internal Medicine

## 2023-05-17 ENCOUNTER — Encounter: Payer: Self-pay | Admitting: Internal Medicine

## 2023-05-17 VITALS — BP 129/82 | HR 63 | Ht 66.0 in | Wt 213.5 lb

## 2023-05-17 DIAGNOSIS — Z1231 Encounter for screening mammogram for malignant neoplasm of breast: Secondary | ICD-10-CM | POA: Diagnosis not present

## 2023-05-17 DIAGNOSIS — I10 Essential (primary) hypertension: Secondary | ICD-10-CM

## 2023-05-17 DIAGNOSIS — R7303 Prediabetes: Secondary | ICD-10-CM

## 2023-05-17 NOTE — Assessment & Plan Note (Signed)
 Blood pressure is well controlled.  Current medications losartan and diuretics Will continue same regimen along with efforts to limit dietary sodium.

## 2023-05-17 NOTE — Assessment & Plan Note (Addendum)
 Managed with diet changes, exercise and weight loss. Continue current regimen - recheck A1C this fall. Lab Results  Component Value Date   HGBA1C 6.3 (H) 10/27/2022

## 2023-05-17 NOTE — Patient Instructions (Signed)
 Call [facility] to schedule your mammogram at [phone].

## 2023-05-17 NOTE — Progress Notes (Signed)
 Date:  05/17/2023   Name:  Lynn Tapia   DOB:  05/10/1955   MRN:  [id number]   Chief Complaint: Hypertension, Weight Check, and Prediabetes  Hypertension This is a chronic problem. The problem is controlled. Pertinent negatives include no chest pain, headaches, palpitations or shortness of breath. Past treatments include angiotensin blockers and diuretics. The current treatment provides significant improvement.  Diabetes She presents for her follow-up diabetic visit. Diabetes type: prediabaetes. Her disease course has been improving. Pertinent negatives for hypoglycemia include no dizziness, headaches or nervousness/anxiousness. Pertinent negatives for diabetes include no chest pain, no fatigue and no weakness. Current diabetic treatment includes diet. She is compliant with treatment most of the time. Her weight is decreasing steadily (has lost 6 lbs in the past 5 weeks).    Review of Systems  Constitutional:  Negative for fatigue and unexpected weight change.  HENT:  Negative for trouble swallowing.   Eyes:  Negative for visual disturbance.  Respiratory:  Negative for cough, chest tightness, shortness of breath and wheezing.   Cardiovascular:  Negative for chest pain, palpitations and leg swelling.  Gastrointestinal:  Negative for abdominal pain, constipation and diarrhea.  Musculoskeletal:  Negative for arthralgias and myalgias.  Neurological:  Negative for dizziness, weakness, light-headedness and headaches.  Psychiatric/Behavioral:  Negative for dysphoric mood and sleep disturbance. The patient is not nervous/anxious.      Lab Results  Component Value Date   NA 140 10/27/2022   K 3.6 10/27/2022   CO2 26 10/27/2022   GLUCOSE 104 (H) 10/27/2022   BUN 12 10/27/2022   CREATININE 0.96 10/27/2022   CALCIUM 9.2 10/27/2022   EGFR 66 10/27/2022   GFRNONAA >60 10/13/2022   Lab Results  Component Value Date   CHOL 216 (H) 10/27/2022   HDL 34 (L) 10/27/2022   LDLCALC 135 (H)  10/27/2022   TRIG 260 (H) 10/27/2022   CHOLHDL 6.4 (H) 10/27/2022   Lab Results  Component Value Date   TSH 2.680 10/27/2022   Lab Results  Component Value Date   HGBA1C 6.3 (H) 10/27/2022   Lab Results  Component Value Date   WBC 7.3 10/27/2022   HGB 13.1 10/27/2022   HCT 39.6 10/27/2022   MCV 94 10/27/2022   PLT 245 10/27/2022   Lab Results  Component Value Date   ALT 49 (H) 10/27/2022   AST 46 (H) 10/27/2022   ALKPHOS 53 10/27/2022   BILITOT 0.3 10/27/2022   No results found for: "25OHVITD2", "25OHVITD3", "VD25OH"   Patient Active Problem List   Diagnosis Date Noted   Obesity, morbid (HCC) 01/28/2023   Prediabetes 10/27/2022   B12 deficiency 06/07/2022   Gouty arthritis of left great toe 05/30/2022   Gouty arthritis 05/21/2022   Gastroesophageal reflux disease 05/21/2022   BMI 35.0-35.9,adult 05/21/2022   Gastritis 05/13/2022   Superior labrum anterior-to-posterior (SLAP) tear of right shoulder 02/11/2022   Cervical spondylosis 05/22/2021   Family history of breast cancer in first degree relative 09/16/2020   Migraine with aura and without status migrainosus, not intractable 09/16/2020   Mixed hyperlipidemia 03/09/2018   Asymptomatic microscopic hematuria 04/29/2016   Chronic idiopathic constipation 04/20/2016   Internal hemorrhoids with complication 09/17/2015   Rectocele 09/17/2015   History of adenomatous polyp of colon 03/06/2015   Hypertensive heart disease without CHF 11/28/2014   Primary hypertension 11/26/2012    Allergies  Allergen Reactions   Penicillins     Reports this happened at 10 months old    Sulfa  Antibiotics Rash   Telmisartan Other (See Comments)    Patient reports nightmares     Past Surgical History:  Procedure Laterality Date   ABDOMINAL HYSTERECTOMY  03/2008   ANUS SURGERY  12/2018   CATARACT EXTRACTION W/PHACO Left 03/02/2023   Procedure: CATARACT EXTRACTION PHACO AND INTRAOCULAR LENS PLACEMENT (IOC) LEFT CLAREON VIVITY 3.52  00:19.1;  Surgeon: Lockie Mola, MD;  Location: [facility];  Service: Ophthalmology;  Laterality: Left;   CATARACT EXTRACTION W/PHACO Right 03/16/2023   Procedure: CATARACT EXTRACTION PHACO AND INTRAOCULAR LENS PLACEMENT (IOC) RIGHT  CLAREON VIVITY TORIC 2.62 00:20.0;  Surgeon: Lockie Mola, MD;  Location: [facility];  Service: Ophthalmology;  Laterality: Right;   CHOLECYSTECTOMY  2002   at [facility] ARTHROSCOPY WITH SUBACROMIAL DECOMPRESSION, ROTATOR CUFF REPAIR AND BICEP TENDON REPAIR Right 02/11/2022   Procedure: SHOULDER ARTHROSCOPY WITH DEBRIDEMENT, DECOMPRESSION, ROTATOR CUFF REPAIR AND BIPEPS TENODESIS.;  Surgeon: Christena Flake, MD;  Location: [facility];  Service: Orthopedics;  Laterality: Right;    Social History   Tobacco Use   Smoking status: Never   Smokeless tobacco: Never  Vaping Use   Vaping status: Never Used  Substance Use Topics   Alcohol use: Never   Drug use: Never     Medication list has been reviewed and updated.  Current Meds  Medication Sig   acetaminophen (TYLENOL) 500 MG tablet Take 1,000 mg by mouth every 6 (six) hours as needed.   acyclovir ointment (ZOVIRAX) 5 % Apply 1 Application topically every 3 (three) hours.   allopurinol (ZYLOPRIM) 100 MG tablet Take 2 tablets (200 mg total) by mouth daily.   ascorbic acid (VITAMIN C) 1000 MG tablet Take 1,000 mg by mouth daily.   chlorthalidone (HYGROTON) 25 MG tablet TAKE 2 TABLETS BY MOUTH EVERY MORNING.   cholecalciferol (VITAMIN D3) 25 MCG (1000 UNIT) tablet Take 1,000 Units by mouth daily.   colchicine 0.6 MG tablet TAKE 1 TABLET BY MOUTH 2 TIMES DAILY.   cyanocobalamin (VITAMIN B12) 1000 MCG tablet Take 1,000 mcg by mouth daily.   docusate sodium (COLACE) 100 MG capsule Take 100 mg by mouth 2 (two) times daily.   fluocinonide cream (LIDEX) 0.05 % Apply 1 Application topically 2 (two) times daily.   fluticasone (FLONASE) 50 MCG/ACT nasal spray Place 2 sprays into both  nostrils daily.   ibuprofen (ADVIL) 200 MG tablet Take 200 mg by mouth every 6 (six) hours as needed.   KLOR-CON M10 10 MEQ tablet TAKE 2 TABLETS BY MOUTH DAILY   losartan (COZAAR) 25 MG tablet TAKE 1 TABLET (25 MG TOTAL) BY MOUTH DAILY.   magnesium oxide (MAG-OX) 400 (240 Mg) MG tablet Take 400 mg by mouth daily.   polyethylene glycol (MIRALAX / GLYCOLAX) 17 g packet Take 17 g by mouth daily.   prednisoLONE acetate (PRED FORTE) 1 % ophthalmic suspension Place 1 drop into the left eye 2 (two) times daily.   SUMAtriptan (IMITREX) 50 MG tablet Take 1 tablet (50 mg total) by mouth every 2 (two) hours as needed for migraine. May repeat in 2 hours if headache persists or recurs.   triamcinolone cream (KENALOG) 0.1 % Apply 1 Application topically 2 (two) times daily.       05/17/2023    9:43 AM 10/27/2022   10:11 AM 10/13/2022    1:42 PM 09/15/2022   11:21 AM  GAD 7 : Generalized Anxiety Score  Nervous, Anxious, on Edge 0 0 0 0  Control/stop worrying 0 0 0  0  Worry too much - different things 0 0 0 0  Trouble relaxing 0 0 0 0  Restless 0 0 0 0  Easily annoyed or irritable 0 0 0 0  Afraid - awful might happen 0 0 0 0  Total GAD 7 Score 0 0 0 0  Anxiety Difficulty Not difficult at all Not difficult at all Not difficult at all Not difficult at all       05/17/2023    9:42 AM 10/27/2022   10:11 AM 10/13/2022    1:42 PM  Depression screen PHQ 2/9  Decreased Interest 0 0 0  Down, Depressed, Hopeless 0 0 0  PHQ - 2 Score 0 0 0  Altered sleeping 0 0 0  Tired, decreased energy 0 0 0  Change in appetite 0 0 0  Feeling bad or failure about yourself  0 0 0  Trouble concentrating 0 0 0  Moving slowly or fidgety/restless 0 0 0  Suicidal thoughts 0 0 0  PHQ-9 Score 0 0 0  Difficult doing work/chores Not difficult at all Not difficult at all Not difficult at all    BP Readings from Last 3 Encounters:  05/17/23 129/82  03/16/23 112/73  03/02/23 101/74    Physical Exam Vitals and nursing note  reviewed.  Constitutional:      General: She is not in acute distress.    Appearance: She is well-developed.  HENT:     Head: Normocephalic and atraumatic.  Cardiovascular:     Rate and Rhythm: Normal rate and regular rhythm.  Pulmonary:     Effort: Pulmonary effort is normal. No respiratory distress.     Breath sounds: No wheezing or rhonchi.  Musculoskeletal:     Cervical back: Normal range of motion.     Right lower leg: No edema.     Left lower leg: No edema.  Skin:    General: Skin is warm and dry.     Findings: No rash.  Neurological:     General: No focal deficit present.     Mental Status: She is alert and oriented to person, place, and time.  Psychiatric:        Mood and Affect: Mood normal.        Behavior: Behavior normal.     Wt Readings from Last 3 Encounters:  05/17/23 213 lb 8 oz (96.8 kg)  03/16/23 219 lb (99.3 kg)  03/02/23 219 lb 14.4 oz (99.7 kg)    BP 129/82   Pulse 63   Ht 5\' 6"  (1.676 m)   Wt 213 lb 8 oz (96.8 kg)   SpO2 96%   BMI 34.46 kg/m   Assessment and Plan:  Problem List Items Addressed This Visit       Unprioritized   Primary hypertension - Primary (Chronic)   Blood pressure is well controlled.  Current medications losartan and diuretics Will continue same regimen along with efforts to limit dietary sodium.       Prediabetes   Managed with diet changes, exercise and weight loss. Continue current regimen - recheck A1C this fall. Lab Results  Component Value Date   HGBA1C 6.3 (H) 10/27/2022        Other Visit Diagnoses       Encounter for screening mammogram for breast cancer       Relevant Orders   MM 3D SCREENING MAMMOGRAM BILATERAL BREAST       No follow-ups on file.    Reubin Milan, MD  [facility] [place]

## 2023-06-06 ENCOUNTER — Telehealth: Payer: Self-pay

## 2023-06-06 NOTE — Telephone Encounter (Signed)
 Called patient to discuss her message regarding labs, no answer. No VM message left.

## 2023-06-06 NOTE — Telephone Encounter (Signed)
 Copied from CRM [id number]. Topic: General - Other >> Jun 03, 2023 12:36 PM Felizardo Hotter wrote: Reason for CRM: Pt would like lab work done, she has been feeling tried. Pt is scheduled for appt on 06/08/2023. Please call pt at [phone].

## 2023-06-07 ENCOUNTER — Telehealth: Payer: Self-pay | Admitting: Internal Medicine

## 2023-06-07 NOTE — Telephone Encounter (Signed)
 Please see previous telephone encounter.

## 2023-06-07 NOTE — Telephone Encounter (Signed)
 Copied from CRM [id number]. Topic: General - Other >> Jun 07, 2023  3:12 PM DeAngela L wrote: Reason for CRM: Patient would like a call back from Itzel the CMA in the office  Patient call back number [phone] (M)

## 2023-06-07 NOTE — Telephone Encounter (Signed)
 Spoke with patient regarding her message about labs and the appointment scheduled for 06/08/2023.  Patient stated that she went out shopping and felt tired and out of breath, she sat down to catch her breath, she was concern enough to make an acute appointment for 06/08/2023.  Patient mentioned that she feels fine now, has even cut her yard outside. She wants to know if Dr. Gala Jubilee recommends she come in or not or just wants her to do labs. Patient stated she has an appointment with Cardiology on Jun 09, 2023 but would like to know what are Dr. Starleen Eastern recommendations.

## 2023-06-07 NOTE — Telephone Encounter (Signed)
 Spoke with patient. Discussed providers instructions, patient verbalized understanding. Appointment with Dr. Berglund will be canceled.

## 2023-06-08 ENCOUNTER — Ambulatory Visit: Admitting: Internal Medicine

## 2023-06-09 ENCOUNTER — Ambulatory Visit: Admitting: Emergency Medicine

## 2023-06-09 VITALS — Ht 67.0 in | Wt 212.0 lb

## 2023-06-09 DIAGNOSIS — Z0000 Encounter for general adult medical examination without abnormal findings: Secondary | ICD-10-CM | POA: Diagnosis not present

## 2023-06-09 DIAGNOSIS — R0609 Other forms of dyspnea: Secondary | ICD-10-CM | POA: Diagnosis not present

## 2023-06-09 DIAGNOSIS — I119 Hypertensive heart disease without heart failure: Secondary | ICD-10-CM | POA: Diagnosis not present

## 2023-06-09 NOTE — Progress Notes (Signed)
 Subjective:   Lynn Tapia is a 68 y.o. who presents for a Medicare Wellness preventive visit.  Visit Complete: Virtual I connected with  Lynn Tapia on 06/09/23 by a audio enabled telemedicine application and verified that I am speaking with the correct person using two identifiers.  Patient Location: Home  Provider Location: Home Office  I discussed the limitations of evaluation and management by telemedicine. The patient expressed understanding and agreed to proceed.  Vital Signs: Because this visit was a virtual/telehealth visit, some criteria may be missing or patient reported. Any vitals not documented were not able to be obtained and vitals that have been documented are patient reported.  VideoDeclined- This patient declined Librarian, academic. Therefore the visit was completed with audio only.  Persons Participating in Visit: Patient.  AWV Questionnaire: No: Patient Medicare AWV questionnaire was not completed prior to this visit.  Cardiac Risk Factors include: advanced age (>53men, >24 women);dyslipidemia;hypertension;obesity (BMI >30kg/m2)     Objective:    Today's Vitals   06/09/23 0758  Weight: 212 lb (96.2 kg)  Height: 5\' 7"  (1.702 m)   Body mass index is 33.2 kg/m.     06/09/2023    8:15 AM 03/16/2023   11:18 AM 03/02/2023    8:16 AM 05/21/2022   10:48 AM 02/11/2022   12:13 PM 02/03/2022    9:04 AM  Advanced Directives  Does Patient Have a Medical Advance Directive? Yes Yes Yes No No No  Type of Estate agent of [place];Living will Living will;Healthcare Power of [employer] Power of [place];Living will     Does patient want to make changes to medical advance directive? No - Patient declined No - Patient declined      Copy of Healthcare Power of Attorney in Chart? No - copy requested No - copy requested No - copy requested       Current Medications (verified) Outpatient Encounter Medications as  of 06/09/2023  Medication Sig   acetaminophen  (TYLENOL ) 500 MG tablet Take 1,000 mg by mouth every 6 (six) hours as needed.   acyclovir ointment (ZOVIRAX) 5 % Apply 1 Application topically every 3 (three) hours.   allopurinol  (ZYLOPRIM ) 100 MG tablet Take 2 tablets (200 mg total) by mouth daily.   ascorbic acid (VITAMIN C) 1000 MG tablet Take 1,000 mg by mouth daily.   chlorthalidone (HYGROTON) 25 MG tablet TAKE 2 TABLETS BY MOUTH EVERY MORNING.   cholecalciferol (VITAMIN D3) 25 MCG (1000 UNIT) tablet Take 1,000 Units by mouth daily.   colchicine  0.6 MG tablet TAKE 1 TABLET BY MOUTH 2 TIMES DAILY.   cyanocobalamin  (VITAMIN B12) 1000 MCG tablet Take 1,000 mcg by mouth daily.   docusate sodium (COLACE) 100 MG capsule Take 100 mg by mouth 2 (two) times daily.   fluocinonide cream (LIDEX) 0.05 % Apply 1 Application topically 2 (two) times daily.   fluticasone  (FLONASE ) 50 MCG/ACT nasal spray Place 2 sprays into both nostrils daily.   ibuprofen (ADVIL) 200 MG tablet Take 200 mg by mouth every 6 (six) hours as needed.   KLOR-CON M10 10 MEQ tablet TAKE 2 TABLETS BY MOUTH DAILY   losartan  (COZAAR ) 25 MG tablet TAKE 1 TABLET (25 MG TOTAL) BY MOUTH DAILY.   magnesium oxide (MAG-OX) 400 (240 Mg) MG tablet Take 400 mg by mouth daily.   polyethylene glycol (MIRALAX / GLYCOLAX) 17 g packet Take 17 g by mouth daily.   SUMAtriptan  (IMITREX ) 50 MG tablet Take 1 tablet (50  mg total) by mouth every 2 (two) hours as needed for migraine. May repeat in 2 hours if headache persists or recurs.   triamcinolone cream (KENALOG) 0.1 % Apply 1 Application topically 2 (two) times daily.   prednisoLONE acetate (PRED FORTE) 1 % ophthalmic suspension Place 1 drop into the left eye 2 (two) times daily. (Patient not taking: Reported on 06/09/2023)   No facility-administered encounter medications on file as of 06/09/2023.    Allergies (verified) Penicillins, Sulfa antibiotics, and Telmisartan   History: Past Medical History:   Diagnosis Date   Adenomatous polyp of colon    Ankle swelling    Arthritis    CHF (congestive heart failure) (HCC)    Gout    Heart murmur    Hypertension    Hypokalemia    Migraine    Mild aortic regurgitation    Non-alcoholic fatty liver disease    Vitamin D deficiency, unspecified    Past Surgical History:  Procedure Laterality Date   ABDOMINAL HYSTERECTOMY  03/2008   ANUS SURGERY  12/2018   CATARACT EXTRACTION W/PHACO Left 03/02/2023   Procedure: CATARACT EXTRACTION PHACO AND INTRAOCULAR LENS PLACEMENT (IOC) LEFT CLAREON VIVITY 3.52 00:19.1;  Surgeon: Annell Kidney, MD;  Location: [facility];  Service: Ophthalmology;  Laterality: Left;   CATARACT EXTRACTION W/PHACO Right 03/16/2023   Procedure: CATARACT EXTRACTION PHACO AND INTRAOCULAR LENS PLACEMENT (IOC) RIGHT  CLAREON VIVITY TORIC 2.62 00:20.0;  Surgeon: Annell Kidney, MD;  Location: [facility];  Service: Ophthalmology;  Laterality: Right;   CHOLECYSTECTOMY  2002   at [facility] ARTHROSCOPY WITH SUBACROMIAL DECOMPRESSION, ROTATOR CUFF REPAIR AND BICEP TENDON REPAIR Right 02/11/2022   Procedure: SHOULDER ARTHROSCOPY WITH DEBRIDEMENT, DECOMPRESSION, ROTATOR CUFF REPAIR AND BIPEPS TENODESIS.;  Surgeon: Elner Hahn, MD;  Location: [facility];  Service: Orthopedics;  Laterality: Right;   Family History  Problem Relation Age of Onset   Breast cancer Mother 67   CAD Mother 86       MI during her breast cancer   Diabetes Mother    Cancer Mother 60       Breast, mastectomy, ultimately metastatic and caused her death   Cancer Father 78       Colon   Hypertension Father    Allergies Father    COPD Sister 57       Smoker   Asthma Sister    Diabetes Sister    Cancer Brother 34       Lung   Stroke Maternal Grandmother        Cerebral hemorrhage   CAD Maternal Grandfather 16   Emphysema Paternal Grandmother    Social History   Socioeconomic History   Marital status: Married    Spouse  name: Lynn Tapia   Number of children: 1   Years of education: Not on file   Highest education level: GED or equivalent  Occupational History   Occupation: retired  Tobacco Use   Smoking status: Never    Passive exposure: Past   Smokeless tobacco: Never  Vaping Use   Vaping status: Never Used  Substance and Sexual Activity   Alcohol use: Never   Drug use: Never   Sexual activity: Yes    Birth control/protection: None  Other Topics Concern   Not on file  Social History Narrative   1 biological daughter and 1 step-daughter   Social Drivers of Health   Financial Resource Strain: Low Risk  (06/09/2023)   Overall Financial Resource Strain (CARDIA)  Difficulty of Paying Living Expenses: Not hard at all  Food Insecurity: No Food Insecurity (06/09/2023)   Hunger Vital Sign    Worried About Running Out of Food in the Last Year: Never true    Ran Out of Food in the Last Year: Never true  Transportation Needs: No Transportation Needs (06/09/2023)   PRAPARE - Administrator, Civil Service (Medical): No    Lack of Transportation (Non-Medical): No  Physical Activity: Sufficiently Active (06/09/2023)   Exercise Vital Sign    Days of Exercise per Week: 2 days    Minutes of Exercise per Session: 90 min  Stress: No Stress Concern Present (06/09/2023)   [employer] of Occupational Health - Occupational Stress Questionnaire    Feeling of Stress : Not at all  Social Connections: Socially Integrated (06/09/2023)   Social Connection and Isolation Panel [NHANES]    Frequency of Communication with Friends and Family: More than three times a week    Frequency of Social Gatherings with Friends and Family: More than three times a week    Attends Religious Services: More than 4 times per year    Active Member of [employer] or Organizations: Yes    Attends Engineer, structural: More than 4 times per year    Marital Status: Married    Tobacco Counseling Counseling given: Not  Answered    Clinical Intake:  Pre-visit preparation completed: Yes  Pain : No/denies pain     BMI - recorded: 33.2 Nutritional Status: BMI > 30  Obese Nutritional Risks: None Diabetes: No  Lab Results  Component Value Date   HGBA1C 6.3 (H) 10/27/2022   HGBA1C 6.1 (H) 06/07/2022     How often do you need to have someone help you when you read instructions, pamphlets, or other written materials from your doctor or pharmacy?: 1 - Never  Interpreter Needed?: No  Information entered by :: Jaunita Messier, CMA   Activities of Daily Living     06/09/2023    8:00 AM 03/16/2023   11:10 AM  In your present state of health, do you have any difficulty performing the following activities:  Hearing? 1 0  Comment can't hear out of left ear   Vision? 0 0  Difficulty concentrating or making decisions? 0 0  Walking or climbing stairs? 0   Dressing or bathing? 0   Doing errands, shopping? 0   Preparing Food and eating ? N   Using the Toilet? N   In the past six months, have you accidently leaked urine? N   Do you have problems with loss of bowel control? N   Managing your Medications? N   Managing your Finances? N   Housekeeping or managing your Housekeeping? N     Patient Care Team: Sheron Dixons, MD as PCP - General (Internal Medicine) Annell Kidney, MD as Referring Physician (Ophthalmology) Borg, Hennie Locks, PA-C as Physician Assistant (Dermatology) Britta Candy, MD as Referring Physician (Cardiology)  Indicate any recent Medical Services you may have received from other than [facility] providers in the past year (date may be approximate).     Assessment:   This is a routine wellness examination for Tyaisha.  Hearing/Vision screen Hearing Screening - Comments:: can't hear out of left ear Vision Screening - Comments:: Gets eye exam, Dr. Reena Canning [place]   Goals Addressed               This Visit's Progress     Weight (lb) <  200 lb (90.7 kg) (pt-stated)    212 lb (96.2 kg)      Depression Screen     06/09/2023    8:13 AM 05/17/2023    9:42 AM 10/27/2022   10:11 AM 10/13/2022    1:42 PM 09/15/2022   11:21 AM 07/27/2022    9:37 AM 05/21/2022   10:48 AM  PHQ 2/9 Scores  PHQ - 2 Score 0 0 0 0 0 0 0  PHQ- 9 Score 1 0 0 0 0 0 0    Fall Risk     06/09/2023    8:16 AM 05/17/2023    9:42 AM 10/27/2022   10:11 AM 10/13/2022    1:42 PM 09/15/2022   11:21 AM  Fall Risk   Falls in the past year? 0 0 0 0 0  Number falls in past yr: 0 0 0 0 0  Injury with Fall? 0 0 0 0 0  Risk for fall due to : No Fall Risks No Fall Risks No Fall Risks No Fall Risks No Fall Risks  Follow up Falls prevention discussed;Falls evaluation completed Falls evaluation completed Falls evaluation completed Falls evaluation completed Falls evaluation completed    MEDICARE RISK AT HOME:  Medicare Risk at Home Any stairs in or around the home?: Yes If so, are there any without handrails?: No Home free of loose throw rugs in walkways, pet beds, electrical cords, etc?: Yes Adequate lighting in your home to reduce risk of falls?: Yes Life alert?: No Use of a cane, walker or w/c?: No Grab bars in the bathroom?: No Shower chair or bench in shower?: No Elevated toilet seat or a handicapped toilet?: No  TIMED UP AND GO:  Was the test performed?  No  Cognitive Function: 6CIT completed        06/09/2023    8:19 AM 05/21/2022   10:49 AM  6CIT Screen  What Year? 0 points 0 points  What month? 0 points 0 points  What time? 0 points 0 points  Count back from 20 0 points 0 points  Months in reverse 0 points 0 points  Repeat phrase 0 points   Total Score 0 points     Immunizations Immunization History  Administered Date(s) Administered   Fluad Trivalent(High Dose 65+) 10/27/2022   Influenza Inj Mdck Quad Pf 11/03/2017, 11/11/2020, 10/19/2021   Influenza Nasal 10/26/2012   Influenza,inj,quad, With Preservative 11/21/2014   Influenza-Unspecified 10/25/2015, 11/02/2016,  11/03/2017, 11/04/2018   PFIZER Comirnaty(Gray Top)Covid-19 Tri-Sucrose Vaccine 02/22/2019, 03/15/2019, 06/02/2020   PFIZER(Purple Top)SARS-COV-2 Vaccination 02/22/2019, 03/15/2019, 11/06/2019   PNEUMOCOCCAL CONJUGATE-20 05/21/2022   Pfizer(Comirnaty)Fall Seasonal Vaccine 12 years and older 11/26/2021   Pneumococcal Polysaccharide-23 02/09/2008   Respiratory Syncytial Virus Vaccine,Recomb Aduvanted(Arexvy) 12/18/2021   Tdap 12/07/2012, 11/26/2021   Zoster Recombinant(Shingrix) 08/24/2016, 01/20/2017    Screening Tests Health Maintenance  Topic Date Due   COVID-19 Vaccine (8 - 2024-25 season) 10/10/2022   INFLUENZA VACCINE  09/09/2023   MAMMOGRAM  11/02/2023   Medicare Annual Wellness (AWV)  06/08/2024   Colonoscopy  01/20/2026   DEXA SCAN  11/09/2027   DTaP/Tdap/Td (3 - Td or Tdap) 11/27/2031   Pneumonia Vaccine 63+ Years old  Completed   Hepatitis C Screening  Completed   Zoster Vaccines- Shingrix  Completed   HPV VACCINES  Aged Out   Meningococcal B Vaccine  Aged Out    Health Maintenance  Health Maintenance Due  Topic Date Due   COVID-19 Vaccine (8 - 2024-25 season) 10/10/2022  Health Maintenance Items Addressed: See Nurse Notes  Additional Screening:  Vision Screening: Recommended annual ophthalmology exams for early detection of glaucoma and other disorders of the eye.  Dental Screening: Recommended annual dental exams for proper oral hygiene  Community Resource Referral / Chronic Care Management: CRR required this visit?  No   CCM required this visit?  No     Plan:     I have personally reviewed and noted the following in the patient's chart:   Medical and social history Use of alcohol, tobacco or illicit drugs  Current medications and supplements including opioid prescriptions. Patient is not currently taking opioid prescriptions. Functional ability and status Nutritional status Physical activity Advanced directives List of other  physicians Hospitalizations, surgeries, and ER visits in previous 12 months Vitals Screenings to include cognitive, depression, and falls Referrals and appointments  In addition, I have reviewed and discussed with patient certain preventive protocols, quality metrics, and best practice recommendations. A written personalized care plan for preventive services as well as general preventive health recommendations were provided to patient.     Jaunita Messier, CMA   06/09/2023   After Visit Summary: (MyChart) Due to this being a telephonic visit, the after visit summary with patients personalized plan was offered to patient via MyChart   Notes: Nothing significant to report at this time.

## 2023-06-09 NOTE — Patient Instructions (Addendum)
 [name] , Thank you for taking time to come for your Medicare Wellness Visit. I appreciate your ongoing commitment to your health goals. Please review the following plan we discussed and let me know if I can assist you in the future.   Referrals/Orders/Follow-Ups/Clinician Recommendations: Your mammogram is due 11/03/23. Call [facility] @ [phone] to schedule at your convenience.  This is a list of the screening recommended for you and due dates:  Health Maintenance  Topic Date Due   COVID-19 Vaccine (8 - 2024-25 season) 10/10/2022   Flu Shot  09/09/2023   Mammogram  11/02/2023   Medicare Annual Wellness Visit  06/08/2024   Colon Cancer Screening  01/20/2026   DEXA scan (bone density measurement)  11/09/2027   DTaP/Tdap/Td vaccine (3 - Td or Tdap) 11/27/2031   Pneumonia Vaccine  Completed   Hepatitis C Screening  Completed   Zoster (Shingles) Vaccine  Completed   HPV Vaccine  Aged Out   Meningitis B Vaccine  Aged Out    Advanced directives: (Copy Requested) Please bring a copy of your health care power of attorney and living will to the office to be added to your chart at your convenience. You can mail to [facility] [address] [place], [place] [address] or email to [email or web address]  Next Medicare Annual Wellness Visit scheduled for next year: Yes, 06/21/24 @ 8:00am (phone visit)

## 2023-06-14 DIAGNOSIS — H43813 Vitreous degeneration, bilateral: Secondary | ICD-10-CM | POA: Diagnosis not present

## 2023-06-14 DIAGNOSIS — H59099 Other disorders of unspecified eye following cataract surgery: Secondary | ICD-10-CM | POA: Diagnosis not present

## 2023-06-14 DIAGNOSIS — Z961 Presence of intraocular lens: Secondary | ICD-10-CM | POA: Diagnosis not present

## 2023-06-14 DIAGNOSIS — H04123 Dry eye syndrome of bilateral lacrimal glands: Secondary | ICD-10-CM | POA: Diagnosis not present

## 2023-06-24 DIAGNOSIS — R0609 Other forms of dyspnea: Secondary | ICD-10-CM | POA: Diagnosis not present

## 2023-06-27 DIAGNOSIS — R0609 Other forms of dyspnea: Secondary | ICD-10-CM | POA: Diagnosis not present

## 2023-07-15 DIAGNOSIS — M722 Plantar fascial fibromatosis: Secondary | ICD-10-CM | POA: Diagnosis not present

## 2023-07-15 DIAGNOSIS — M79672 Pain in left foot: Secondary | ICD-10-CM | POA: Diagnosis not present

## 2023-07-15 DIAGNOSIS — M7662 Achilles tendinitis, left leg: Secondary | ICD-10-CM | POA: Diagnosis not present

## 2023-07-15 DIAGNOSIS — M7732 Calcaneal spur, left foot: Secondary | ICD-10-CM | POA: Diagnosis not present

## 2023-07-18 DIAGNOSIS — Z712 Person consulting for explanation of examination or test findings: Secondary | ICD-10-CM | POA: Diagnosis not present

## 2023-07-18 DIAGNOSIS — I10 Essential (primary) hypertension: Secondary | ICD-10-CM | POA: Diagnosis not present

## 2023-07-21 DIAGNOSIS — D2262 Melanocytic nevi of left upper limb, including shoulder: Secondary | ICD-10-CM | POA: Diagnosis not present

## 2023-07-21 DIAGNOSIS — L538 Other specified erythematous conditions: Secondary | ICD-10-CM | POA: Diagnosis not present

## 2023-07-21 DIAGNOSIS — R208 Other disturbances of skin sensation: Secondary | ICD-10-CM | POA: Diagnosis not present

## 2023-07-21 DIAGNOSIS — L820 Inflamed seborrheic keratosis: Secondary | ICD-10-CM | POA: Diagnosis not present

## 2023-07-21 DIAGNOSIS — D2261 Melanocytic nevi of right upper limb, including shoulder: Secondary | ICD-10-CM | POA: Diagnosis not present

## 2023-07-21 DIAGNOSIS — D225 Melanocytic nevi of trunk: Secondary | ICD-10-CM | POA: Diagnosis not present

## 2023-07-21 DIAGNOSIS — L570 Actinic keratosis: Secondary | ICD-10-CM | POA: Diagnosis not present

## 2023-07-21 DIAGNOSIS — D2272 Melanocytic nevi of left lower limb, including hip: Secondary | ICD-10-CM | POA: Diagnosis not present

## 2023-07-27 ENCOUNTER — Other Ambulatory Visit: Payer: Self-pay | Admitting: Internal Medicine

## 2023-07-27 DIAGNOSIS — I10 Essential (primary) hypertension: Secondary | ICD-10-CM

## 2023-07-28 NOTE — Telephone Encounter (Signed)
 Requested Prescriptions  Pending Prescriptions Disp Refills   losartan  (COZAAR ) 25 MG tablet [Pharmacy Med Name: LOSARTAN  POTASSIUM 25 MG TAB] 90 tablet 0    Sig: TAKE 1 TABLET (25 MG TOTAL) BY MOUTH DAILY.     Cardiovascular:  Angiotensin Receptor Blockers Failed - 07/28/2023  5:59 PM      Failed - Cr in normal range and within 180 days    Creatinine, Ser  Date Value Ref Range Status  10/27/2022 0.96 0.57 - 1.00 mg/dL Final         Failed - K in normal range and within 180 days    Potassium  Date Value Ref Range Status  10/27/2022 3.6 3.5 - 5.2 mmol/L Final         Passed - Patient is not pregnant      Passed - Last BP in normal range    BP Readings from Last 1 Encounters:  05/17/23 129/82         Passed - Valid encounter within last 6 months    Recent Outpatient Visits           2 months ago Primary hypertension   [place] [facility] at [facility], Chales [place], MD       Future Appointments             In 3 months Gala Jubilee, Chales [place], MD [facility] at [facility], [facility]

## 2023-07-31 ENCOUNTER — Other Ambulatory Visit: Payer: Self-pay | Admitting: Internal Medicine

## 2023-08-02 ENCOUNTER — Other Ambulatory Visit: Payer: Self-pay | Admitting: Internal Medicine

## 2023-08-02 DIAGNOSIS — I10 Essential (primary) hypertension: Secondary | ICD-10-CM

## 2023-08-02 NOTE — Telephone Encounter (Signed)
 Too soon for refill, refilled 05/04/23 for 16g and 3 refills.  Requested Prescriptions  Pending Prescriptions Disp Refills   fluticasone  (FLONASE ) 50 MCG/ACT nasal spray [Pharmacy Med Name: FLUTICASONE  PROP 50 MCG SPRAY] 48 mL 1    Sig: SPRAY 2 SPRAYS INTO EACH NOSTRIL EVERY DAY     Ear, Nose, and Throat: Nasal Preparations - Corticosteroids Passed - 08/02/2023  2:07 PM      Passed - Valid encounter within last 12 months    Recent Outpatient Visits           2 months ago Primary hypertension   [place] [facility] at [facility], Lynn DEL, Lynn Tapia       Future Appointments             In 3 months Lynn Tapia, Lynn DEL, Lynn Tapia [facility] at [facility], [facility]

## 2023-08-03 NOTE — Telephone Encounter (Signed)
 Duplicate, refilled on 07/28/23.  Requested Prescriptions  Pending Prescriptions Disp Refills   losartan  (COZAAR ) 25 MG tablet [Pharmacy Med Name: LOSARTAN  POTASSIUM 25 MG TAB] 90 tablet 0    Sig: TAKE 1 TABLET (25 MG TOTAL) BY MOUTH DAILY.     Cardiovascular:  Angiotensin Receptor Blockers Failed - 08/03/2023  3:52 PM      Failed - Cr in normal range and within 180 days    Creatinine, Ser  Date Value Ref Range Status  10/27/2022 0.96 0.57 - 1.00 mg/dL Final         Failed - K in normal range and within 180 days    Potassium  Date Value Ref Range Status  10/27/2022 3.6 3.5 - 5.2 mmol/L Final         Passed - Patient is not pregnant      Passed - Last BP in normal range    BP Readings from Last 1 Encounters:  05/17/23 129/82         Passed - Valid encounter within last 6 months    Recent Outpatient Visits           2 months ago Primary hypertension   [place] [facility] at [facility], Leita DEL, MD       Future Appointments             In 2 months Justus, Leita DEL, MD [facility] at [facility], [facility]

## 2023-08-25 DIAGNOSIS — Z961 Presence of intraocular lens: Secondary | ICD-10-CM | POA: Diagnosis not present

## 2023-08-25 DIAGNOSIS — H43813 Vitreous degeneration, bilateral: Secondary | ICD-10-CM | POA: Diagnosis not present

## 2023-10-11 ENCOUNTER — Ambulatory Visit (INDEPENDENT_AMBULATORY_CARE_PROVIDER_SITE_OTHER): Admitting: Internal Medicine

## 2023-10-11 ENCOUNTER — Encounter: Payer: Self-pay | Admitting: Internal Medicine

## 2023-10-11 ENCOUNTER — Ambulatory Visit: Payer: Self-pay

## 2023-10-11 VITALS — BP 122/76 | HR 77 | Ht 67.0 in | Wt 212.0 lb

## 2023-10-11 DIAGNOSIS — I10 Essential (primary) hypertension: Secondary | ICD-10-CM | POA: Diagnosis not present

## 2023-10-11 DIAGNOSIS — B009 Herpesviral infection, unspecified: Secondary | ICD-10-CM | POA: Diagnosis not present

## 2023-10-11 DIAGNOSIS — N762 Acute vulvitis: Secondary | ICD-10-CM | POA: Diagnosis not present

## 2023-10-11 MED ORDER — DOXYCYCLINE HYCLATE 100 MG PO TABS: 100.0000 mg | ORAL_TABLET | Freq: Two times a day (BID) | ORAL | 0 refills | Status: AC

## 2023-10-11 MED ORDER — LOSARTAN POTASSIUM 25 MG PO TABS: 25.0000 mg | ORAL_TABLET | Freq: Every day | ORAL | 0 refills | Status: DC

## 2023-10-11 NOTE — Telephone Encounter (Signed)
 Noted  Pt has a appt.  KP

## 2023-10-11 NOTE — Assessment & Plan Note (Signed)
 Blood pressure is well controlled on losartan  with amlodipine recently added by Cardiology. No medication side effects noted. Plan to continue current medications.

## 2023-10-11 NOTE — Assessment & Plan Note (Signed)
 Recommend topical zovirax ointment for recurrent lesions

## 2023-10-11 NOTE — Progress Notes (Signed)
 Date:  10/11/2023   Name:  Lynn Tapia   DOB:  20-Jan-1956   MRN:  [id number]   Chief Complaint: Vaginal Pain (Patient said she has a painful boil on her left Labia.)  Vaginal Pain The patient's primary symptoms include genital lesions. This is a recurrent problem. The current episode started yesterday. The problem has been gradually worsening. The pain is mild. The problem affects the left side. Pertinent negatives include no chills or fever. Treatments tried: topical cream.  Hypertension This is a chronic problem. The problem is controlled. Pertinent negatives include no chest pain, palpitations or shortness of breath. Past treatments include angiotensin blockers and calcium  channel blockers. The current treatment provides significant improvement.    Review of Systems  Constitutional:  Negative for chills, fatigue and fever.  Respiratory:  Negative for chest tightness and shortness of breath.   Cardiovascular:  Negative for chest pain and palpitations.  Genitourinary:  Positive for genital sores and vaginal pain.     Lab Results  Component Value Date   NA 140 10/27/2022   K 3.6 10/27/2022   CO2 26 10/27/2022   GLUCOSE 104 (H) 10/27/2022   BUN 12 10/27/2022   CREATININE 0.96 10/27/2022   CALCIUM  9.2 10/27/2022   EGFR 66 10/27/2022   GFRNONAA >60 10/13/2022   Lab Results  Component Value Date   CHOL 216 (H) 10/27/2022   HDL 34 (L) 10/27/2022   LDLCALC 135 (H) 10/27/2022   TRIG 260 (H) 10/27/2022   CHOLHDL 6.4 (H) 10/27/2022   Lab Results  Component Value Date   TSH 2.680 10/27/2022   Lab Results  Component Value Date   HGBA1C 6.3 (H) 10/27/2022   Lab Results  Component Value Date   WBC 7.3 10/27/2022   HGB 13.1 10/27/2022   HCT 39.6 10/27/2022   MCV 94 10/27/2022   PLT 245 10/27/2022   Lab Results  Component Value Date   ALT 49 (H) 10/27/2022   AST 46 (H) 10/27/2022   ALKPHOS 53 10/27/2022   BILITOT 0.3 10/27/2022   No results found for:  MARIEN BOLLS, VD25OH   Patient Active Problem List   Diagnosis Date Noted   HSV (herpes simplex virus) infection 10/11/2023   Obesity, morbid (HCC) 01/28/2023   Prediabetes 10/27/2022   B12 deficiency 06/07/2022   Gouty arthritis of left great toe 05/30/2022   Gouty arthritis 05/21/2022   Gastroesophageal reflux disease 05/21/2022   BMI 35.0-35.9,adult 05/21/2022   Superior labrum anterior-to-posterior (SLAP) tear of right shoulder 02/11/2022   Cervical spondylosis 05/22/2021   Migraine with aura and without status migrainosus, not intractable 09/16/2020   Mixed hyperlipidemia 03/09/2018   Asymptomatic microscopic hematuria 04/29/2016   Chronic idiopathic constipation 04/20/2016   Rectocele 09/17/2015   History of adenomatous polyp of colon 03/06/2015   Hypertensive heart disease without CHF 11/28/2014   Primary hypertension 11/26/2012    Allergies  Allergen Reactions   Penicillins     Reports this happened at 70 months old    Sulfa Antibiotics Rash   Telmisartan Other (See Comments)    Patient reports nightmares     Past Surgical History:  Procedure Laterality Date   ABDOMINAL HYSTERECTOMY  03/2008   ANUS SURGERY  12/2018   CATARACT EXTRACTION W/PHACO Left 03/02/2023   Procedure: CATARACT EXTRACTION PHACO AND INTRAOCULAR LENS PLACEMENT (IOC) LEFT CLAREON VIVITY 3.52 00:19.1;  Surgeon: Mittie Gaskin, MD;  Location: [facility];  Service: Ophthalmology;  Laterality: Left;   CATARACT EXTRACTION W/PHACO Right  03/16/2023   Procedure: CATARACT EXTRACTION PHACO AND INTRAOCULAR LENS PLACEMENT (IOC) RIGHT  CLAREON VIVITY TORIC 2.62 00:20.0;  Surgeon: Mittie Gaskin, MD;  Location: [facility];  Service: Ophthalmology;  Laterality: Right;   CHOLECYSTECTOMY  2002   at [facility] ARTHROSCOPY WITH SUBACROMIAL DECOMPRESSION, ROTATOR CUFF REPAIR AND BICEP TENDON REPAIR Right 02/11/2022   Procedure: SHOULDER ARTHROSCOPY WITH DEBRIDEMENT,  DECOMPRESSION, ROTATOR CUFF REPAIR AND BIPEPS TENODESIS.;  Surgeon: Edie Norleen PARAS, MD;  Location: [facility];  Service: Orthopedics;  Laterality: Right;    Social History   Tobacco Use   Smoking status: Never    Passive exposure: Past   Smokeless tobacco: Never  Vaping Use   Vaping status: Never Used  Substance Use Topics   Alcohol use: Never   Drug use: Never     Medication list has been reviewed and updated.  Current Meds  Medication Sig   acetaminophen  (TYLENOL ) 500 MG tablet Take 1,000 mg by mouth every 6 (six) hours as needed.   acyclovir ointment (ZOVIRAX) 5 % Apply 1 Application topically every 3 (three) hours.   allopurinol  (ZYLOPRIM ) 100 MG tablet Take 2 tablets (200 mg total) by mouth daily.   amLODipine (NORVASC) 5 MG tablet Take 5 mg by mouth daily.   ascorbic acid (VITAMIN C) 1000 MG tablet Take 1,000 mg by mouth daily.   chlorthalidone (HYGROTON) 25 MG tablet TAKE 2 TABLETS BY MOUTH EVERY MORNING.   cholecalciferol (VITAMIN D3) 25 MCG (1000 UNIT) tablet Take 1,000 Units by mouth daily.   colchicine  0.6 MG tablet TAKE 1 TABLET BY MOUTH 2 TIMES DAILY.   cyanocobalamin  (VITAMIN B12) 1000 MCG tablet Take 1,000 mcg by mouth daily.   docusate sodium (COLACE) 100 MG capsule Take 100 mg by mouth 2 (two) times daily.   doxycycline  (VIBRA -TABS) 100 MG tablet Take 1 tablet (100 mg total) by mouth 2 (two) times daily for 7 days.   fluocinonide cream (LIDEX) 0.05 % Apply 1 Application topically 2 (two) times daily.   fluticasone  (FLONASE ) 50 MCG/ACT nasal spray Place 2 sprays into both nostrils daily.   ibuprofen (ADVIL) 200 MG tablet Take 200 mg by mouth every 6 (six) hours as needed.   KLOR-CON M10 10 MEQ tablet TAKE 2 TABLETS BY MOUTH DAILY   losartan  (COZAAR ) 25 MG tablet Take 1 tablet (25 mg total) by mouth daily.   magnesium oxide (MAG-OX) 400 (240 Mg) MG tablet Take 400 mg by mouth daily.   polyethylene glycol (MIRALAX / GLYCOLAX) 17 g packet Take 17 g by mouth daily.    SUMAtriptan  (IMITREX ) 50 MG tablet Take 1 tablet (50 mg total) by mouth every 2 (two) hours as needed for migraine. May repeat in 2 hours if headache persists or recurs.   triamcinolone cream (KENALOG) 0.1 % Apply 1 Application topically 2 (two) times daily.   [DISCONTINUED] losartan  (COZAAR ) 25 MG tablet TAKE 1 TABLET (25 MG TOTAL) BY MOUTH DAILY.       10/11/2023   11:18 AM 05/17/2023    9:43 AM 10/27/2022   10:11 AM 10/13/2022    1:42 PM  GAD 7 : Generalized Anxiety Score  Nervous, Anxious, on Edge 0 0 0 0  Control/stop worrying 0 0 0 0  Worry too much - different things 0 0 0 0  Trouble relaxing 0 0 0 0  Restless 0 0 0 0  Easily annoyed or irritable 0 0 0 0  Afraid - awful might happen 0 0 0 0  Total GAD 7 Score 0 0 0 0  Anxiety Difficulty Not difficult at all Not difficult at all Not difficult at all Not difficult at all       10/11/2023   11:17 AM 06/09/2023    8:13 AM 05/17/2023    9:42 AM  Depression screen PHQ 2/9  Decreased Interest 0 0 0  Down, Depressed, Hopeless 0 0 0  PHQ - 2 Score 0 0 0  Altered sleeping 0 0 0  Tired, decreased energy 1 1 0  Change in appetite 0 0 0  Feeling bad or failure about yourself  0 0 0  Trouble concentrating 0 0 0  Moving slowly or fidgety/restless 0 0 0  Suicidal thoughts 0 0 0  PHQ-9 Score 1 1 0  Difficult doing work/chores Not difficult at all Not difficult at all Not difficult at all    BP Readings from Last 3 Encounters:  10/11/23 122/76  05/17/23 129/82  03/16/23 112/73    Physical Exam Vitals and nursing note reviewed.  Constitutional:      General: She is not in acute distress.    Appearance: Normal appearance. She is well-developed.  HENT:     Head: Normocephalic and atraumatic.     Mouth/Throat:     Lips: Lesions present.     Comments: C/w HSV Cardiovascular:     Rate and Rhythm: Normal rate and regular rhythm.  Pulmonary:     Effort: Pulmonary effort is normal. No respiratory distress.     Breath sounds: No wheezing  or rhonchi.  Genitourinary:    Labia:        Right: Lesion present.       Comments: Red raised tender pustule Skin:    General: Skin is warm and dry.     Findings: No rash.  Neurological:     Mental Status: She is alert and oriented to person, place, and time.  Psychiatric:        Mood and Affect: Mood normal.        Behavior: Behavior normal.     Wt Readings from Last 3 Encounters:  10/11/23 212 lb (96.2 kg)  06/09/23 212 lb (96.2 kg)  05/17/23 213 lb 8 oz (96.8 kg)    BP 122/76   Pulse 77   Ht 5' 7 (1.702 m)   Wt 212 lb (96.2 kg)   SpO2 99%   BMI 33.20 kg/m   Assessment and Plan:  Problem List Items Addressed This Visit       Unprioritized   Primary hypertension (Chronic)   Blood pressure is well controlled on losartan  with amlodipine recently added by Cardiology. No medication side effects noted. Plan to continue current medications.       Relevant Medications   amLODipine (NORVASC) 5 MG tablet   losartan  (COZAAR ) 25 MG tablet   HSV (herpes simplex virus) infection   Recommend topical zovirax ointment for recurrent lesions      Other Visit Diagnoses       Cellulitis of labia majora    -  Primary   warm compresses as needed; loose clothing Doxy x 7 days does not appear to be herpetic   Relevant Medications   doxycycline  (VIBRA -TABS) 100 MG tablet       No follow-ups on file.    Leita HILARIO Adie, MD [facility] [place]

## 2023-10-11 NOTE — Telephone Encounter (Signed)
 FYI Only or Action Required?: FYI only for provider.  Patient was last seen in primary care on 05/17/2023 by Justus Leita DEL, MD.  Called Nurse Triage reporting Abscess.  Symptoms began several days ago.  Interventions attempted: OTC medications: drawing salve.  Symptoms are: left labia boil (about 1/8th of an inch) with severe pain; fever blister lower lip gradually worsening.  Triage Disposition: See HCP Within 4 Hours (Or PCP Triage)  Patient/caregiver understands and will follow disposition?: Yes               Message from [place] M sent at 10/11/2023  8:09 AM EDT  Patiet has boil near vagina- going on vacation this week and wanting to know if a medication can be called in   Reason for Disposition  SEVERE pain (e.g., excruciating)  Answer Assessment - Initial Assessment Questions Patient requesting if PCP can send in a medication for her boil. Advised best if PCP can assess and examine to determine best treatment and patient is agreeable to appt.  1. APPEARANCE of BOIL: What does the boil look like?      Looks like a pimple, with a white head to it.  2. LOCATION: Where is the boil located?      Left inner labia/vaginal area.  3. NUMBER: How many boils are there?      1.  4. SIZE: How big is the boil? (e.g., inches, cm; compare to size of a coin or other object)     Small, 1/8th of an inch.  5. ONSET: When did the boil start?     Friday or Saturday.  6. PAIN: Is there any pain? If Yes, ask: How bad is the pain?   (Scale 1-10; or mild, moderate, severe)     Yes, sore. Very sore 9/10.  7. FEVER: Do you have a fever? If Yes, ask: What is it, how was it measured, and when did it start?      No.  8. SOURCE: Have you been around anyone with boils or other Staph infections? Have you ever had boils before?     She states she had a boil near her right lower buttocks several weeks ago.  9. OTHER SYMPTOMS: Do you have any other symptoms?  (e.g., shaking chills, weakness, rash elsewhere on body)     Fever blister on bottom lip (started this morning). Denies any drainage from boil, rash on body, chills, weakness.  10. PREGNANCY: Is there any chance you are pregnant? When was your last menstrual period?       N/A.  Protocols used: Boil (Skin Abscess)-A-AH

## 2023-10-21 ENCOUNTER — Ambulatory Visit: Payer: Self-pay

## 2023-10-21 NOTE — Telephone Encounter (Signed)
 FYI Only or Action Required?: Action required by provider: request for appointment.  Patient was last seen in primary care on 10/11/2023 by Justus Leita DEL, MD.  Called Nurse Triage reporting Neck Pain.  Symptoms began 2 years ago.  Interventions attempted: OTC medications: tylenol .  Symptoms are: gradually worsening.  Triage Disposition: No disposition on file.  Patient/caregiver understands and will follow disposition?: YesCopied from CRM #[id number]. Topic: Clinical - Red Word Triage >> Oct 21, 2023  8:09 AM [place] A wrote: [facility] that prompted transfer to Nurse Triage: Patient is having neck pain for over a week. Currently in [facility] on vacation. Answer Assessment - Initial Assessment Questions Ongoing pain for nearly 2 years. Pt has seen MD at [facility] and was told she has degenerative disc disease and needed surgery. Pt put on hold for shoulder replacement surgery. Pt states pain is worsening.   Pt is wanting referral to Dr Juliane Budge. Pt is currently on vacation and wants appt for Monday to start process.    1. ONSET: When did the pain begin?      2 years ago  2. LOCATION: Where does it hurt?      Whole neck 3. PATTERN Does the pain come and go, or has it been constant since it started?      Constant but severity worsens  4. SEVERITY: How bad is the pain?  (Scale 0-10; or none or slight stiffness, mild, moderate, severe)     9 5. RADIATION: Does the pain go anywhere else, shoot into your arms?     Into head 6. CORD SYMPTOMS: Any weakness or numbness of the arms or legs?     Tingling in fingers/hand 7. CAUSE: What do you think is causing the neck pain?     Degenerative disc disease  8. NECK OVERUSE: Any recent activities that involved turning or twisting the neck?     na 9. OTHER SYMPTOMS: Do you have any other symptoms? (e.g., headache, fever, chest pain, difficulty breathing, neck swelling)     headache  Protocols used: Neck Pain or  Stiffness-A-AH

## 2023-10-24 ENCOUNTER — Encounter: Payer: Self-pay | Admitting: Internal Medicine

## 2023-10-24 ENCOUNTER — Ambulatory Visit (INDEPENDENT_AMBULATORY_CARE_PROVIDER_SITE_OTHER): Admitting: Internal Medicine

## 2023-10-24 VITALS — BP 110/78 | HR 67 | Ht 67.0 in | Wt 219.0 lb

## 2023-10-24 DIAGNOSIS — M5412 Radiculopathy, cervical region: Secondary | ICD-10-CM | POA: Diagnosis not present

## 2023-10-24 DIAGNOSIS — M4802 Spinal stenosis, cervical region: Secondary | ICD-10-CM | POA: Diagnosis not present

## 2023-10-24 MED ORDER — CYCLOBENZAPRINE HCL 5 MG PO TABS: 5.0000 mg | ORAL_TABLET | Freq: Every day | ORAL | 0 refills | Status: AC

## 2023-10-24 NOTE — Assessment & Plan Note (Signed)
 MRI C-spine 2023 - moderate foraminal stenosis C 5-6 and 6-7 Surgery was recommended but she deferred. Now having more pain and stiffness. Will refer to [facility]  Begin Flexeril  5 mg qhs

## 2023-10-24 NOTE — Progress Notes (Signed)
 Date:  10/24/2023   Name:  Lynn Tapia   DOB:  1955-05-13   MRN:  [id number]   Chief Complaint: Neck Pain  Neck Pain  This is a chronic problem. Episode onset: X2 years. The problem occurs constantly. The problem has been gradually worsening. The pain is associated with nothing. The pain is present in the left side and right side. The pain is at a severity of 5/10. The pain is mild. The symptoms are aggravated by twisting and position. The pain is Same all the time. Associated symptoms include headaches. Pertinent negatives include no fever, numbness or weakness. She has tried ice, heat and acetaminophen  for the symptoms. The treatment provided mild relief.    Review of Systems  Constitutional:  Negative for chills, fatigue and fever.  Respiratory:  Negative for chest tightness and shortness of breath.   Musculoskeletal:  Positive for neck pain. Negative for neck stiffness.  Neurological:  Positive for headaches. Negative for dizziness, weakness and numbness.  Psychiatric/Behavioral:  Positive for sleep disturbance. Negative for dysphoric mood. The patient is not nervous/anxious.      Lab Results  Component Value Date   NA 140 10/27/2022   K 3.6 10/27/2022   CO2 26 10/27/2022   GLUCOSE 104 (H) 10/27/2022   BUN 12 10/27/2022   CREATININE 0.96 10/27/2022   CALCIUM  9.2 10/27/2022   EGFR 66 10/27/2022   GFRNONAA >60 10/13/2022   Lab Results  Component Value Date   CHOL 216 (H) 10/27/2022   HDL 34 (L) 10/27/2022   LDLCALC 135 (H) 10/27/2022   TRIG 260 (H) 10/27/2022   CHOLHDL 6.4 (H) 10/27/2022   Lab Results  Component Value Date   TSH 2.680 10/27/2022   Lab Results  Component Value Date   HGBA1C 6.3 (H) 10/27/2022   Lab Results  Component Value Date   WBC 7.3 10/27/2022   HGB 13.1 10/27/2022   HCT 39.6 10/27/2022   MCV 94 10/27/2022   PLT 245 10/27/2022   Lab Results  Component Value Date   ALT 49 (H) 10/27/2022   AST 46 (H) 10/27/2022   ALKPHOS 53  10/27/2022   BILITOT 0.3 10/27/2022   No results found for: MARIEN BOLLS, VD25OH   Patient Active Problem List   Diagnosis Date Noted   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 10/24/2023   HSV (herpes simplex virus) infection 10/11/2023   Prediabetes 10/27/2022   B12 deficiency 06/07/2022   Gouty arthritis of left great toe 05/30/2022   Gouty arthritis 05/21/2022   Gastroesophageal reflux disease 05/21/2022   Superior labrum anterior-to-posterior (SLAP) tear of right shoulder 02/11/2022   Cervical spondylosis 05/22/2021   Migraine with aura and without status migrainosus, not intractable 09/16/2020   Mixed hyperlipidemia 03/09/2018   Asymptomatic microscopic hematuria 04/29/2016   Chronic idiopathic constipation 04/20/2016   Rectocele 09/17/2015   History of adenomatous polyp of colon 03/06/2015   Hypertensive heart disease without CHF 11/28/2014   Primary hypertension 11/26/2012    Allergies  Allergen Reactions   Penicillins     Reports this happened at 31 months old    Sulfa Antibiotics Rash   Telmisartan Other (See Comments)    Patient reports nightmares     Past Surgical History:  Procedure Laterality Date   ABDOMINAL HYSTERECTOMY  03/2008   ANUS SURGERY  12/2018   CATARACT EXTRACTION W/PHACO Left 03/02/2023   Procedure: CATARACT EXTRACTION PHACO AND INTRAOCULAR LENS PLACEMENT (IOC) LEFT CLAREON VIVITY 3.52 00:19.1;  Surgeon: Mittie,  Dene, MD;  Location: [facility];  Service: Ophthalmology;  Laterality: Left;   CATARACT EXTRACTION W/PHACO Right 03/16/2023   Procedure: CATARACT EXTRACTION PHACO AND INTRAOCULAR LENS PLACEMENT (IOC) RIGHT  CLAREON VIVITY TORIC 2.62 00:20.0;  Surgeon: Mittie Dene, MD;  Location: [facility];  Service: Ophthalmology;  Laterality: Right;   CHOLECYSTECTOMY  2002   at [facility] ARTHROSCOPY WITH SUBACROMIAL DECOMPRESSION, ROTATOR CUFF REPAIR AND BICEP TENDON REPAIR Right 02/11/2022    Procedure: SHOULDER ARTHROSCOPY WITH DEBRIDEMENT, DECOMPRESSION, ROTATOR CUFF REPAIR AND BIPEPS TENODESIS.;  Surgeon: Edie Norleen PARAS, MD;  Location: [facility];  Service: Orthopedics;  Laterality: Right;    Social History   Tobacco Use   Smoking status: Never    Passive exposure: Past   Smokeless tobacco: Never  Vaping Use   Vaping status: Never Used  Substance Use Topics   Alcohol use: Never   Drug use: Never     Medication list has been reviewed and updated.  Current Meds  Medication Sig   acetaminophen  (TYLENOL ) 500 MG tablet Take 1,000 mg by mouth every 6 (six) hours as needed.   acyclovir ointment (ZOVIRAX) 5 % Apply 1 Application topically every 3 (three) hours.   allopurinol  (ZYLOPRIM ) 100 MG tablet Take 2 tablets (200 mg total) by mouth daily.   amLODipine (NORVASC) 5 MG tablet Take 5 mg by mouth daily.   ascorbic acid (VITAMIN C) 1000 MG tablet Take 1,000 mg by mouth daily.   chlorthalidone (HYGROTON) 25 MG tablet TAKE 2 TABLETS BY MOUTH EVERY MORNING.   cholecalciferol (VITAMIN D3) 25 MCG (1000 UNIT) tablet Take 1,000 Units by mouth daily.   colchicine  0.6 MG tablet TAKE 1 TABLET BY MOUTH 2 TIMES DAILY.   cyanocobalamin  (VITAMIN B12) 1000 MCG tablet Take 1,000 mcg by mouth daily.   cyclobenzaprine  (FLEXERIL ) 5 MG tablet Take 1 tablet (5 mg total) by mouth at bedtime.   docusate sodium (COLACE) 100 MG capsule Take 100 mg by mouth 2 (two) times daily.   fluocinonide cream (LIDEX) 0.05 % Apply 1 Application topically 2 (two) times daily.   fluticasone  (FLONASE ) 50 MCG/ACT nasal spray Place 2 sprays into both nostrils daily.   ibuprofen (ADVIL) 200 MG tablet Take 200 mg by mouth every 6 (six) hours as needed.   KLOR-CON M10 10 MEQ tablet TAKE 2 TABLETS BY MOUTH DAILY   losartan  (COZAAR ) 25 MG tablet Take 1 tablet (25 mg total) by mouth daily.   magnesium oxide (MAG-OX) 400 (240 Mg) MG tablet Take 400 mg by mouth daily.   polyethylene glycol (MIRALAX / GLYCOLAX) 17 g packet  Take 17 g by mouth daily.   SUMAtriptan  (IMITREX ) 50 MG tablet Take 1 tablet (50 mg total) by mouth every 2 (two) hours as needed for migraine. May repeat in 2 hours if headache persists or recurs.   triamcinolone cream (KENALOG) 0.1 % Apply 1 Application topically 2 (two) times daily.       10/11/2023   11:18 AM 05/17/2023    9:43 AM 10/27/2022   10:11 AM 10/13/2022    1:42 PM  GAD 7 : Generalized Anxiety Score  Nervous, Anxious, on Edge 0 0 0 0  Control/stop worrying 0 0 0 0  Worry too much - different things 0 0 0 0  Trouble relaxing 0 0 0 0  Restless 0 0 0 0  Easily annoyed or irritable 0 0 0 0  Afraid - awful might happen 0 0 0 0  Total GAD 7  Score 0 0 0 0  Anxiety Difficulty Not difficult at all Not difficult at all Not difficult at all Not difficult at all       10/11/2023   11:17 AM 06/09/2023    8:13 AM 05/17/2023    9:42 AM  Depression screen PHQ 2/9  Decreased Interest 0 0 0  Down, Depressed, Hopeless 0 0 0  PHQ - 2 Score 0 0 0  Altered sleeping 0 0 0  Tired, decreased energy 1 1 0  Change in appetite 0 0 0  Feeling bad or failure about yourself  0 0 0  Trouble concentrating 0 0 0  Moving slowly or fidgety/restless 0 0 0  Suicidal thoughts 0 0 0  PHQ-9 Score 1 1 0  Difficult doing work/chores Not difficult at all Not difficult at all Not difficult at all    BP Readings from Last 3 Encounters:  10/24/23 110/78  10/11/23 122/76  05/17/23 129/82    Physical Exam Vitals and nursing note reviewed.  Constitutional:      General: She is not in acute distress.    Appearance: Normal appearance. She is well-developed.  HENT:     Head: Normocephalic and atraumatic.  Cardiovascular:     Rate and Rhythm: Normal rate and regular rhythm.  Pulmonary:     Effort: Pulmonary effort is normal. No respiratory distress.     Breath sounds: No wheezing or rhonchi.  Musculoskeletal:     Cervical back: Spasms and tenderness present. Pain with movement present. Decreased range of  motion.  Skin:    General: Skin is warm and dry.     Findings: No rash.  Neurological:     Mental Status: She is alert and oriented to person, place, and time.     Sensory: Sensation is intact.     Motor: Motor function is intact.     Deep Tendon Reflexes:     Reflex Scores:      Bicep reflexes are 1+ on the right side and 1+ on the left side. Psychiatric:        Mood and Affect: Mood normal.        Behavior: Behavior normal.     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 Now having more pain and stiffness. Will refer to [facility]  Begin Flexeril  5 mg qhs      Relevant Medications   cyclobenzaprine  (FLEXERIL ) 5 MG tablet   Other Relevant Orders   Ambulatory referral to Neurosurgery    No follow-ups on file.    Leita HILARIO Adie, MD [facility] [place]

## 2023-10-25 ENCOUNTER — Other Ambulatory Visit: Payer: Self-pay | Admitting: Internal Medicine

## 2023-10-25 DIAGNOSIS — M109 Gout, unspecified: Secondary | ICD-10-CM

## 2023-10-26 NOTE — Telephone Encounter (Signed)
 Requested medication (s) are due for refill today: yes  Requested medication (s) are on the active medication list: yes  Last refill:  01/31/23 #180 1 RF  Future visit scheduled: yes  Notes to clinic:  overdue lab work    Requested Prescriptions  Pending Prescriptions Disp Refills   allopurinol  (ZYLOPRIM ) 100 MG tablet [Pharmacy Med Name: ALLOPURINOL  100 MG TABLET] 180 tablet 1    Sig: TAKE 2 TABLETS BY MOUTH EVERY DAY     Endocrinology:  Gout Agents - allopurinol  Failed - 10/26/2023 12:03 PM      Failed - Uric Acid in normal range and within 360 days    Uric Acid  Date Value Ref Range Status  10/27/2022 6.6 3.0 - 7.2 mg/dL Final    Comment:               Therapeutic target for gout patients: <6.0         Failed - Cr in normal range and within 360 days    Creatinine, Ser  Date Value Ref Range Status  10/27/2022 0.96 0.57 - 1.00 mg/dL Final         Failed - CBC within normal limits and completed in the last 12 months    WBC  Date Value Ref Range Status  10/27/2022 7.3 3.4 - 10.8 x10E3/uL Final   RBC  Date Value Ref Range Status  10/27/2022 4.21 3.77 - 5.28 x10E6/uL Final   Hemoglobin  Date Value Ref Range Status  10/27/2022 13.1 11.1 - 15.9 g/dL Final   Hematocrit  Date Value Ref Range Status  10/27/2022 39.6 34.0 - 46.6 % Final   MCHC  Date Value Ref Range Status  10/27/2022 33.1 31.5 - 35.7 g/dL Final   [facility]  Date Value Ref Range Status  10/27/2022 31.1 26.6 - 33.0 pg Final   MCV  Date Value Ref Range Status  10/27/2022 94 79 - 97 fL Final   No results found for: PLTCOUNTKUC, LABPLAT, POCPLA RDW  Date Value Ref Range Status  10/27/2022 13.1 11.7 - 15.4 % Final         Passed - Valid encounter within last 12 months    Recent Outpatient Visits           2 days ago 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   [facility] at [facility], Leita DEL, MD   2 weeks ago Cellulitis of labia  majora   [facility] at [facility], Leita DEL, MD   5 months ago Primary hypertension   [facility] at [facility], Leita DEL, MD

## 2023-10-28 DIAGNOSIS — Z6835 Body mass index (BMI) 35.0-35.9, adult: Secondary | ICD-10-CM | POA: Diagnosis not present

## 2023-10-28 DIAGNOSIS — M542 Cervicalgia: Secondary | ICD-10-CM | POA: Diagnosis not present

## 2023-10-31 ENCOUNTER — Encounter: Payer: Self-pay | Admitting: Internal Medicine

## 2023-11-01 DIAGNOSIS — M542 Cervicalgia: Secondary | ICD-10-CM | POA: Diagnosis not present

## 2023-11-01 DIAGNOSIS — M6281 Muscle weakness (generalized): Secondary | ICD-10-CM | POA: Diagnosis not present

## 2023-11-03 ENCOUNTER — Ambulatory Visit
Admission: RE | Admit: 2023-11-03 | Discharge: 2023-11-03 | Disposition: A | Discharge status: Home / Self Care | Source: Ambulatory Visit | Attending: Internal Medicine | Admitting: Internal Medicine

## 2023-11-03 DIAGNOSIS — Z1231 Encounter for screening mammogram for malignant neoplasm of breast: Secondary | ICD-10-CM | POA: Diagnosis not present

## 2023-11-08 DIAGNOSIS — M6281 Muscle weakness (generalized): Secondary | ICD-10-CM | POA: Diagnosis not present

## 2023-11-08 DIAGNOSIS — M542 Cervicalgia: Secondary | ICD-10-CM | POA: Diagnosis not present

## 2023-11-10 DIAGNOSIS — M542 Cervicalgia: Secondary | ICD-10-CM | POA: Diagnosis not present

## 2023-11-10 DIAGNOSIS — M6281 Muscle weakness (generalized): Secondary | ICD-10-CM | POA: Diagnosis not present

## 2023-11-15 DIAGNOSIS — M542 Cervicalgia: Secondary | ICD-10-CM | POA: Diagnosis not present

## 2023-11-15 DIAGNOSIS — M6281 Muscle weakness (generalized): Secondary | ICD-10-CM | POA: Diagnosis not present

## 2023-11-16 DIAGNOSIS — M722 Plantar fascial fibromatosis: Secondary | ICD-10-CM | POA: Diagnosis not present

## 2023-11-17 ENCOUNTER — Other Ambulatory Visit: Payer: Self-pay | Admitting: Internal Medicine

## 2023-11-17 DIAGNOSIS — I10 Essential (primary) hypertension: Secondary | ICD-10-CM

## 2023-11-17 DIAGNOSIS — M542 Cervicalgia: Secondary | ICD-10-CM | POA: Diagnosis not present

## 2023-11-17 DIAGNOSIS — M6281 Muscle weakness (generalized): Secondary | ICD-10-CM | POA: Diagnosis not present

## 2023-11-21 NOTE — Telephone Encounter (Signed)
 Too soon for refill, LRF 10/11/23 FOR 90 DAYS.  Requested Prescriptions  Pending Prescriptions Disp Refills   losartan  (COZAAR ) 25 MG tablet [Pharmacy Med Name: LOSARTAN  POTASSIUM 25 MG TAB] 90 tablet 0    Sig: TAKE 1 TABLET (25 MG TOTAL) BY MOUTH DAILY.     Cardiovascular:  Angiotensin Receptor Blockers Failed - 11/21/2023 12:19 PM      Failed - Cr in normal range and within 180 days    Creatinine, Ser  Date Value Ref Range Status  10/27/2022 0.96 0.57 - 1.00 mg/dL Final         Failed - K in normal range and within 180 days    Potassium  Date Value Ref Range Status  10/27/2022 3.6 3.5 - 5.2 mmol/L Final         Passed - Patient is not pregnant      Passed - Last BP in normal range    BP Readings from Last 1 Encounters:  10/24/23 110/78         Passed - Valid encounter within last 6 months    Recent Outpatient Visits           4 weeks ago 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   [facility] at [facility], Leita DEL, MD   1 month ago Cellulitis of labia majora   [facility] at [facility], Leita DEL, MD   6 months ago Primary hypertension   [facility] at [facility], Leita DEL, MD

## 2023-11-22 DIAGNOSIS — M6281 Muscle weakness (generalized): Secondary | ICD-10-CM | POA: Diagnosis not present

## 2023-11-22 DIAGNOSIS — M542 Cervicalgia: Secondary | ICD-10-CM | POA: Diagnosis not present

## 2023-11-23 DIAGNOSIS — M6281 Muscle weakness (generalized): Secondary | ICD-10-CM | POA: Diagnosis not present

## 2023-11-23 DIAGNOSIS — M542 Cervicalgia: Secondary | ICD-10-CM | POA: Diagnosis not present

## 2023-11-24 ENCOUNTER — Ambulatory Visit (INDEPENDENT_AMBULATORY_CARE_PROVIDER_SITE_OTHER): Admitting: Internal Medicine

## 2023-11-24 ENCOUNTER — Encounter: Payer: Self-pay | Admitting: Internal Medicine

## 2023-11-24 VITALS — BP 122/74 | Ht 67.0 in | Wt 218.8 lb

## 2023-11-24 DIAGNOSIS — M4802 Spinal stenosis, cervical region: Secondary | ICD-10-CM

## 2023-11-24 DIAGNOSIS — B009 Herpesviral infection, unspecified: Secondary | ICD-10-CM

## 2023-11-24 DIAGNOSIS — K219 Gastro-esophageal reflux disease without esophagitis: Secondary | ICD-10-CM | POA: Diagnosis not present

## 2023-11-24 DIAGNOSIS — G43109 Migraine with aura, not intractable, without status migrainosus: Secondary | ICD-10-CM | POA: Diagnosis not present

## 2023-11-24 DIAGNOSIS — R7303 Prediabetes: Secondary | ICD-10-CM

## 2023-11-24 DIAGNOSIS — K5904 Chronic idiopathic constipation: Secondary | ICD-10-CM

## 2023-11-24 DIAGNOSIS — I10 Essential (primary) hypertension: Secondary | ICD-10-CM

## 2023-11-24 DIAGNOSIS — E782 Mixed hyperlipidemia: Secondary | ICD-10-CM

## 2023-11-24 DIAGNOSIS — K760 Fatty (change of) liver, not elsewhere classified: Secondary | ICD-10-CM | POA: Insufficient documentation

## 2023-11-24 DIAGNOSIS — M109 Gout, unspecified: Secondary | ICD-10-CM | POA: Diagnosis not present

## 2023-11-24 DIAGNOSIS — Z23 Encounter for immunization: Secondary | ICD-10-CM | POA: Diagnosis not present

## 2023-11-24 DIAGNOSIS — M5412 Radiculopathy, cervical region: Secondary | ICD-10-CM

## 2023-11-24 MED ORDER — LOSARTAN POTASSIUM 25 MG PO TABS: 25.0000 mg | ORAL_TABLET | Freq: Every day | ORAL | 1 refills | Status: AC

## 2023-11-24 NOTE — Progress Notes (Signed)
 Subjective:    Patient ID: Lynn Tapia, female    DOB: 06-Dec-1955, 68 y.o.   MRN: [id number]  HPI  Patient presents to clinic today to establish care and for management of the conditions listed below.  HTN: Her BP today is 122/74.  She is taking amlodipine, chlorthalidone, potassium and losartan  as prescribed.  There is no ECG on file.  Migraines: These occur rarely.  Triggered by stress but have improved.  She takes sumatriptan  as needed with good results.  She does not follow with neurology.  GERD: Currently not an issue.  She does not take any medication for this. There is no upper GI on file.  Chronic constipation: Managed with docusate, magnesium and MiraLAX.  She does not follow with GI.  Gout: She denies recent flare.  She is taking allopurinol  as prescribed and colchicine  as needed for flare.  Chronic neck pain: Managed with acetaminophen  and cyclobenzaprine .  MRI cervical spine from 06/2021 reviewed. She is currently going to PT.  HLD: Her last LDL was 135, triglycerides 260, 10/2022.  She is not taking any cholesterol-lowering medication at this time.  She tries to consume low-fat diet.  Prediabetes: Her last A1c was 6.3%, 10/2022.  She is not taking any oral diabetic medication at this time.  She does not check her sugars.  Cold sores: Managed with acyclovir ointment. She does not follow with dermatology.  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  She is not currently taking any medications for this.  US  from 05/2012 reviewed.  Fibrosis 4 Score = 1.77 Score is based on outdated labs. ALT, AST, and platelets should all be measured within the last 6 months for an accurate FIB-4 Score  Fib-4 interpretation is not validated for people under 35 or over 33 years of age. However, scores under 2.0 are generally considered low risk.  Review of Systems   Past Medical History:  Diagnosis Date   Adenomatous polyp of colon    Ankle swelling    Arthritis    CHF (congestive  heart failure) (HCC)    Gout    Heart murmur    Hypertension    Hypokalemia    Internal hemorrhoids with complication 09/17/2015   Formatting of this note might be different from the original. Last Assessment & Plan: Formatting of this note might be different from the original. Long history of constipation and hemorrhoids s/p IRC.  Per patient, constipation is better but still has symptoms from hemorrhoids (anal and rectal pain sensitivity, occasional rectal bleeding, hemorrhoid prolapse).  Not [facility] to [facility]. Discussed con   Migraine    Mild aortic regurgitation    Non-alcoholic fatty liver disease    Vitamin D deficiency, unspecified     Current Outpatient Medications  Medication Sig Dispense Refill   acetaminophen  (TYLENOL ) 500 MG tablet Take 1,000 mg by mouth every 6 (six) hours as needed.     acyclovir ointment (ZOVIRAX) 5 % Apply 1 Application topically every 3 (three) hours.     allopurinol  (ZYLOPRIM ) 100 MG tablet TAKE 2 TABLETS BY MOUTH EVERY DAY 180 tablet 1   amLODipine (NORVASC) 5 MG tablet Take 5 mg by mouth daily.     ascorbic acid (VITAMIN C) 1000 MG tablet Take 1,000 mg by mouth daily.     chlorthalidone (HYGROTON) 25 MG tablet TAKE 2 TABLETS BY MOUTH EVERY MORNING. 180 tablet 3   cholecalciferol (VITAMIN D3) 25 MCG (1000 UNIT) tablet Take 1,000 Units by mouth daily.  colchicine  0.6 MG tablet TAKE 1 TABLET BY MOUTH 2 TIMES DAILY. 180 tablet 1   cyanocobalamin  (VITAMIN B12) 1000 MCG tablet Take 1,000 mcg by mouth daily.     cyclobenzaprine  (FLEXERIL ) 5 MG tablet Take 1 tablet (5 mg total) by mouth at bedtime. 30 tablet 0   docusate sodium (COLACE) 100 MG capsule Take 100 mg by mouth 2 (two) times daily.     fluocinonide cream (LIDEX) 0.05 % Apply 1 Application topically 2 (two) times daily.     fluticasone  (FLONASE ) 50 MCG/ACT nasal spray Place 2 sprays into both nostrils daily. 16 g 3   ibuprofen (ADVIL) 200 MG tablet Take 200 mg by mouth every 6 (six) hours as  needed.     KLOR-CON M10 10 MEQ tablet TAKE 2 TABLETS BY MOUTH DAILY 180 tablet 3   losartan  (COZAAR ) 25 MG tablet Take 1 tablet (25 mg total) by mouth daily. 90 tablet 0   magnesium oxide (MAG-OX) 400 (240 Mg) MG tablet Take 400 mg by mouth daily.     polyethylene glycol (MIRALAX / GLYCOLAX) 17 g packet Take 17 g by mouth daily.     SUMAtriptan  (IMITREX ) 50 MG tablet Take 1 tablet (50 mg total) by mouth every 2 (two) hours as needed for migraine. May repeat in 2 hours if headache persists or recurs. 10 tablet 3   triamcinolone cream (KENALOG) 0.1 % Apply 1 Application topically 2 (two) times daily.     No current facility-administered medications for this visit.    Allergies  Allergen Reactions   Penicillins     Reports this happened at 61 months old    Sulfa Antibiotics Rash   Telmisartan Other (See Comments)    Patient reports nightmares     Family History  Problem Relation Age of Onset   Breast cancer Mother 53   CAD Mother 54       MI during her breast cancer   Diabetes Mother    Cancer Mother 11       Breast, mastectomy, ultimately metastatic and caused her death   Cancer Father 7       Colon   Hypertension Father    Allergies Father    COPD Sister 65       Smoker   Asthma Sister    Diabetes Sister    Cancer Brother 13       Lung   Stroke Maternal Grandmother        Cerebral hemorrhage   CAD Maternal Grandfather 14   Emphysema Paternal Grandmother     Social History   Socioeconomic History   Marital status: Married    Spouse name: Sudie   Number of children: 1   Years of education: Not on file   Highest education level: GED or equivalent  Occupational History   Occupation: retired  Tobacco Use   Smoking status: Never    Passive exposure: Past   Smokeless tobacco: Never  Vaping Use   Vaping status: Never Used  Substance and Sexual Activity   Alcohol use: Never   Drug use: Never   Sexual activity: Yes    Birth control/protection: None  Other  Topics Concern   Not on file  Social History Narrative   1 biological daughter and 1 step-daughter   Social Drivers of Health   Financial Resource Strain: Low Risk  (11/20/2023)   Overall Financial Resource Strain (CARDIA)    Difficulty of Paying Living Expenses: Not hard at all  Food Insecurity:  No Food Insecurity (11/20/2023)   Hunger Vital Sign    Worried About Running Out of Food in the Last Year: Never true    Ran Out of Food in the Last Year: Never true  Transportation Needs: No Transportation Needs (11/20/2023)   PRAPARE - Administrator, Civil Service (Medical): No    Lack of Transportation (Non-Medical): No  Physical Activity: Inactive (11/20/2023)   Exercise Vital Sign    Days of Exercise per Week: 0 days    Minutes of Exercise per Session: Not on file  Stress: No Stress Concern Present (11/20/2023)   [employer] of Occupational Health - Occupational Stress Questionnaire    Feeling of Stress: Not at all  Social Connections: Socially Integrated (11/20/2023)   Social Connection and Isolation Panel    Frequency of Communication with Friends and Family: More than three times a week    Frequency of Social Gatherings with Friends and Family: More than three times a week    Attends Religious Services: More than 4 times per year    Active Member of [employer] or Organizations: Yes    Attends Banker Meetings: 1 to 4 times per year    Marital Status: Married  Catering manager Violence: Not At Risk (06/09/2023)   Humiliation, Afraid, Rape, and Kick questionnaire    Fear of Current or Ex-Partner: No    Emotionally Abused: No    Physically Abused: No    Sexually Abused: No     Constitutional: Patient reports intermittent headaches.  Denies fever, malaise, fatigue, or abrupt weight changes.  HEENT: Denies eye pain, eye redness, ear pain, ringing in the ears, wax buildup, runny nose, nasal congestion, bloody nose, or sore throat. Respiratory: Denies  difficulty breathing, shortness of breath, cough or sputum production.   Cardiovascular: Denies chest pain, chest tightness, palpitations or swelling in the hands or feet.  Gastrointestinal: Patient reports constipation.  Denies abdominal pain, bloating, diarrhea or blood in the stool.  GU: Denies urgency, frequency, pain with urination, burning sensation, blood in urine, odor or discharge. Musculoskeletal: Patient reports chronic neck pain.  Denies decrease in range of motion, difficulty with gait, muscle pain or joint swelling.  Skin: Denies redness, rashes, lesions or ulcercations.  Neurological: Denies dizziness, difficulty with memory, difficulty with speech or problems with balance and coordination.  Psych: Denies anxiety, depression, SI/HI.  No other specific complaints in a complete review of systems (except as listed in HPI above).      Objective:   Physical Exam BP 122/74 (BP Location: Left Arm, Patient Position: Sitting, Cuff Size: Large)   Ht 5' 7 (1.702 m)   Wt 218 lb 12.8 oz (99.2 kg)   BMI 34.27 kg/m   Wt Readings from Last 3 Encounters:  10/24/23 219 lb (99.3 kg)  10/11/23 212 lb (96.2 kg)  06/09/23 212 lb (96.2 kg)    General: Appears her stated age, obese,, in NAD. Skin: Warm, dry and intact. HEENT: Head: normal shape and size; Eyes: sclera white, no icterus, conjunctiva pink, PERRLA and EOMs intact;  Cardiovascular: Normal rate and rhythm. S1,S2 noted.  Murmur noted. No JVD or BLE edema. No carotid bruits noted. Pulmonary/Chest: Normal effort and positive vesicular breath sounds. No respiratory distress. No wheezes, rales or ronchi noted.  Abdomen: Normal bowel sounds.  Musculoskeletal: No difficulty with gait.  Neurological: Alert and oriented. Coordination normal.  Psychiatric: Mood and affect normal. Behavior is normal. Judgment and thought content normal.    BMET  Component Value Date/Time   NA 140 10/27/2022 1054   K 3.6 10/27/2022 1054   CL 99  10/27/2022 1054   CO2 26 10/27/2022 1054   GLUCOSE 104 (H) 10/27/2022 1054   GLUCOSE 138 (H) 10/13/2022 1410   BUN 12 10/27/2022 1054   CREATININE 0.96 10/27/2022 1054   CALCIUM  9.2 10/27/2022 1054   GFRNONAA >60 10/13/2022 1410    Lipid Panel     Component Value Date/Time   CHOL 216 (H) 10/27/2022 1054   TRIG 260 (H) 10/27/2022 1054   HDL 34 (L) 10/27/2022 1054   CHOLHDL 6.4 (H) 10/27/2022 1054   LDLCALC 135 (H) 10/27/2022 1054    CBC    Component Value Date/Time   WBC 7.3 10/27/2022 1054   RBC 4.21 10/27/2022 1054   HGB 13.1 10/27/2022 1054   HCT 39.6 10/27/2022 1054   PLT 245 10/27/2022 1054   MCV 94 10/27/2022 1054   MCH 31.1 10/27/2022 1054   MCHC 33.1 10/27/2022 1054   RDW 13.1 10/27/2022 1054   LYMPHSABS 2.8 10/27/2022 1054   EOSABS 0.3 10/27/2022 1054   BASOSABS 0.0 10/27/2022 1054    Hgb A1C Lab Results  Component Value Date   HGBA1C 6.3 (H) 10/27/2022            Assessment & Plan:    RTC in 6 months for your annual exam Angeline Laura, NP

## 2023-11-24 NOTE — Assessment & Plan Note (Signed)
 Uric acid level today Continue allopurinol  200 mg daily Continue colchicine  0.6 mg twice daily as needed Encouraged low purine diet

## 2023-11-24 NOTE — Assessment & Plan Note (Signed)
 Complicated by obesity Controlled on amlodipine 5 mg, chlorthalidone 50 mg and losartan  25 mg daily She will continue potassium 10 mEq twice daily Reinforced DASH diet and exercise for weight loss C-Met today

## 2023-11-24 NOTE — Assessment & Plan Note (Signed)
 Continue acyclovir ointment 5% as needed

## 2023-11-24 NOTE — Patient Instructions (Signed)
 Fatty Liver Disease (Steatotic Liver Disease): What to Know  Your liver is an organ with many jobs. It makes proteins and helps change food into energy. It also gets rid of harmful things in your blood and absorbs vitamins from food. Fatty liver disease happens when too much fat builds up in your liver cells. It's also called steatotic liver disease. In many cases, fatty liver disease doesn't cause symptoms. But over time, it can cause irritation and swelling. This can lead to other liver problems, such as: Cirrhosis, or scarring of the liver. Liver cancer. Liver failure. What are the causes? Fatty liver disease may be caused by: Being overweight. Having: High cholesterol. High blood pressure. Cushing syndrome. Not getting enough nutrients in your diet. Other causes include: Certain drugs. Poisons. Some infections caused by a germ called a virus. What increases the risk? You're more likely to get fatty liver disease if: You drink alcohol. You're overweight. You have diabetes. You have hepatitis. You have a high triglyceride level. You're pregnant. What are the signs or symptoms? You may not have symptoms. If you do, they may include: Feeling weak and tired. Losing weight. Feeling like you may throw up. Throwing up. Jaundice. This is when your skin or the white parts of your eyes turn yellow. Swelling in your belly or legs. Tenderness in the right-upper part of your belly. How is this diagnosed? Fatty liver disease may be diagnosed based on your medical history and an exam. You may also need tests. These may include: Blood tests. An ultrasound. A CT scan. An MRI. A biopsy. This is when a small piece of tissue is removed from your liver for testing. How is this treated? Fatty liver disease is often caused by other conditions. You may need to take medicines and make changes to your daily life. These changes may help you manage conditions, such as: Alcohol use disorder. This  is a condition where you may not be able to stop drinking. High cholesterol. Diabetes. Being overweight. Follow these instructions at home: Eat healthy. Work with your health care provider or an expert in healthy eating called a dietitian. They can help you make an eating plan. Get enough exercise. This can help you lose weight. It can also help you manage your cholesterol and diabetes. Talk to your provider about an exercise plan. Ask what things are best for you to do. Do not drink alcohol. If you have trouble quitting, ask your provider for help. Take your medicines only as told. Keep all follow-up visits. Your provider will check if you're getting better. Contact a health care provider if: You can't control your blood sugar. This is extra important if you have diabetes. You have a fever. You have swelling in your belly or legs. You have belly pain. You have jaundice. You feel like you may throw up. You throw up. Get help right away if: You throw up, and it looks like: Bright red blood. Coffee grounds. You throw up something that looks like coffee ground. Your poop looks bloody or black. You get confused. These symptoms may be an emergency. Call 911 right away. Do not wait to see if the symptoms will go away. Do not drive yourself to the hospital. This information is not intended to replace advice given to you by your health care provider. Make sure you discuss any questions you have with your health care provider. Document Revised: 07/15/2022 Document Reviewed: 07/15/2022 Elsevier Patient Education  2024 [employer].

## 2023-11-24 NOTE — Assessment & Plan Note (Signed)
 Encouraged low fat diet and exercise for weight loss C-Met and ELF today Consider semaglutide for treatment if ELF score elevated

## 2023-11-24 NOTE — Assessment & Plan Note (Signed)
 Complicated by obesity A1c today Encourage low-carb diet and exercise for weight loss

## 2023-11-24 NOTE — Assessment & Plan Note (Signed)
 Complicated by obesity C-Met and lipid profile today Encouraged her to consume a low-fat diet

## 2023-11-24 NOTE — Assessment & Plan Note (Signed)
 Currently in PT Continue tylenol  OTC and cyclobenzaprine  5 mg nightly as needed

## 2023-11-24 NOTE — Assessment & Plan Note (Signed)
 Encouraged stress reduction techniques Continue sumatriptan  25 mg daily as needed

## 2023-11-24 NOTE — Assessment & Plan Note (Signed)
 Encouraged high-fiber diet and adequate water intake Continue MiraLAX 17 g daily, docusate 100 mg twice daily and magnesium 400 mg at bedtime

## 2023-11-24 NOTE — Assessment & Plan Note (Signed)
 Currently not an issue We will monitor

## 2023-11-25 ENCOUNTER — Ambulatory Visit: Payer: Self-pay | Admitting: Internal Medicine

## 2023-11-27 LAB — CBC
Hematocrit: 40.8 % (ref 34.0–46.6)
Hemoglobin: 13.6 g/dL (ref 11.1–15.9)
MCH: 31.3 pg (ref 26.6–33.0)
MCHC: 33.3 g/dL (ref 31.5–35.7)
MCV: 94 fL (ref 79–97)
Platelets: 249 x10E3/uL (ref 150–450)
RBC: 4.35 x10E6/uL (ref 3.77–5.28)
RDW: 12.1 % (ref 11.7–15.4)
WBC: 9.1 x10E3/uL (ref 3.4–10.8)

## 2023-11-27 LAB — COMPREHENSIVE METABOLIC PANEL WITH GFR
ALT: 39 IU/L — ABNORMAL HIGH (ref 0–32)
AST: 37 IU/L (ref 0–40)
Albumin: 4.5 g/dL (ref 3.9–4.9)
Alkaline Phosphatase: 56 IU/L (ref 49–135)
BUN/Creatinine Ratio: 21 (ref 12–28)
BUN: 19 mg/dL (ref 8–27)
Bilirubin Total: 0.4 mg/dL (ref 0.0–1.2)
CO2: 27 mmol/L (ref 20–29)
Calcium: 9.2 mg/dL (ref 8.7–10.3)
Chloride: 95 mmol/L — ABNORMAL LOW (ref 96–106)
Creatinine, Ser: 0.91 mg/dL (ref 0.57–1.00)
Globulin, Total: 2.5 g/dL (ref 1.5–4.5)
Glucose: 100 mg/dL — ABNORMAL HIGH (ref 70–99)
Potassium: 3.9 mmol/L (ref 3.5–5.2)
Sodium: 141 mmol/L (ref 134–144)
Total Protein: 7 g/dL (ref 6.0–8.5)
eGFR: 70 mL/min/1.73 (ref >59)

## 2023-11-27 LAB — LIPID PANEL
Chol/HDL Ratio: 4.7 ratio — ABNORMAL HIGH (ref 0.0–4.4)
Cholesterol, Total: 198 mg/dL (ref 100–199)
HDL: 42 mg/dL (ref >39)
LDL Chol Calc (NIH): 124 mg/dL — ABNORMAL HIGH (ref 0–99)
Triglycerides: 180 mg/dL — ABNORMAL HIGH (ref 0–149)
VLDL Cholesterol Cal: 32 mg/dL (ref 5–40)

## 2023-11-27 LAB — ENHANCED LIVER FIBROSIS (ELF): ELF(TM) Score: 9.04 (ref <9.80)

## 2023-11-27 LAB — HEMOGLOBIN A1C
Est. average glucose Bld gHb Est-mCnc: 134 mg/dL
Hgb A1c MFr Bld: 6.3 % — ABNORMAL HIGH (ref 4.8–5.6)

## 2023-11-27 LAB — URIC ACID: Uric Acid: 6.3 mg/dL (ref 3.0–7.2)

## 2023-11-29 DIAGNOSIS — M6281 Muscle weakness (generalized): Secondary | ICD-10-CM | POA: Diagnosis not present

## 2023-11-29 DIAGNOSIS — M542 Cervicalgia: Secondary | ICD-10-CM | POA: Diagnosis not present

## 2023-12-01 DIAGNOSIS — M542 Cervicalgia: Secondary | ICD-10-CM | POA: Diagnosis not present

## 2023-12-01 DIAGNOSIS — M6281 Muscle weakness (generalized): Secondary | ICD-10-CM | POA: Diagnosis not present

## 2023-12-06 DIAGNOSIS — M542 Cervicalgia: Secondary | ICD-10-CM | POA: Diagnosis not present

## 2023-12-06 DIAGNOSIS — M6281 Muscle weakness (generalized): Secondary | ICD-10-CM | POA: Diagnosis not present

## 2023-12-13 DIAGNOSIS — M6281 Muscle weakness (generalized): Secondary | ICD-10-CM | POA: Diagnosis not present

## 2023-12-13 DIAGNOSIS — M542 Cervicalgia: Secondary | ICD-10-CM | POA: Diagnosis not present

## 2023-12-15 DIAGNOSIS — M6281 Muscle weakness (generalized): Secondary | ICD-10-CM | POA: Diagnosis not present

## 2023-12-15 DIAGNOSIS — M542 Cervicalgia: Secondary | ICD-10-CM | POA: Diagnosis not present

## 2023-12-22 DIAGNOSIS — M6281 Muscle weakness (generalized): Secondary | ICD-10-CM | POA: Diagnosis not present

## 2023-12-22 DIAGNOSIS — M542 Cervicalgia: Secondary | ICD-10-CM | POA: Diagnosis not present

## 2023-12-23 ENCOUNTER — Encounter: Payer: Self-pay | Admitting: Internal Medicine

## 2023-12-23 ENCOUNTER — Ambulatory Visit (INDEPENDENT_AMBULATORY_CARE_PROVIDER_SITE_OTHER): Admitting: Internal Medicine

## 2023-12-23 VITALS — BP 124/74 | Ht 67.0 in | Wt 215.4 lb

## 2023-12-23 DIAGNOSIS — R1312 Dysphagia, oropharyngeal phase: Secondary | ICD-10-CM

## 2023-12-23 DIAGNOSIS — M5412 Radiculopathy, cervical region: Secondary | ICD-10-CM | POA: Diagnosis not present

## 2023-12-23 DIAGNOSIS — M4802 Spinal stenosis, cervical region: Secondary | ICD-10-CM | POA: Diagnosis not present

## 2023-12-23 MED ORDER — OMEPRAZOLE 40 MG PO CPDR: 40.0000 mg | DELAYED_RELEASE_CAPSULE | Freq: Every day | ORAL | 0 refills | Status: AC

## 2023-12-23 NOTE — Patient Instructions (Signed)
 Problems With Swallowing (Dysphagia): What to Know  Dysphagia means having trouble swallowing. It happens when food and drinks stick in your throat on the way down to your stomach, or when food takes longer to get to your stomach than usual. You may have problems swallowing food, liquids, or both. You may also have pain while trying to swallow. It may take you more time and effort to swallow something. What are the causes? Dysphagia may be caused by: Muscle problems. These may make it hard for you to move food and liquids through your esophagus, which is the tube that connects your mouth to your stomach. Blockages. You may have ulcers, scar tissue, or inflammation that blocks the normal passage of food and liquids. Causes of these problems include: Acid reflux from your stomach into your esophagus. Infections. Radiation treatment for cancer. Taking medicines without enough fluids to help them go down into your stomach. Stroke. This can affect the nerves and make it hard to swallow. Nerve problems. These prevent signals from being sent to the muscles of your esophagus. These signals tell the muscles to squeeze (contract) and move what you swallow down to your stomach. Globus pharyngeus. This is a common problem that can make you feel like something is stuck in your throat or like swallowing is difficult, even though nothing is wrong with the swallowing passages. Certain conditions, such as cerebral palsy or Parkinson's disease. What are the signs or symptoms? Common symptoms include: A feeling that solids or liquids are stuck in your throat on the way down to the stomach. Pain while swallowing. Coughing or gagging while trying to swallow. Other symptoms include: Food moving back from your stomach to your mouth. This is called regurgitation. Noises coming from your throat. Chest discomfort when swallowing. A feeling of fullness when swallowing. Drooling, especially when the throat is  blocked. Heartburn. How is this diagnosed? Dysphagia may be diagnosed by doing tests. These may include: Video fluoroscopic swallow study, also called a barium swallow. In this test, you'll swallow a white liquid that sticks to the inside of your esophagus. X-ray images are then taken. Endoscopy. In this test, a flexible scope is put down your throat to look at your esophagus and your stomach. CT scans or an MRI. How is this treated? Treatment for dysphagia depends on the cause: If it's caused by acid reflux or infection, medicines may be used. These may include antibiotics or heartburn medicines. If it's caused by problems with the muscles, you may need swallowing therapy. This can help you strengthen your swallowing muscles. You may have to do specific exercises to strengthen the muscles or stretch them. If it's caused by a blockage or mass, procedures to remove the blockage may be done. You may need surgery and a feeding tube. You may also need to make diet changes. Talk with your health care provider about specific changes. Follow these instructions at home: Medicines Take your medicines only as told. If you were given antibiotics, take them as told. Do not stop taking them even if you start to feel better. Eating and drinking  Make any diet changes as told by your provider. Work with an Financial controller in healthy eating called a dietitian. They can help you create an eating plan to make sure you get the nutrients you need. Eat soft foods that are easier to swallow. Cut your food into small pieces and eat slowly. Take small bites. Eat and drink only when you're sitting upright. Do not drink alcohol or  caffeine . If you need help quitting, ask your provider. General instructions Check your weight every day to make sure you're not losing weight. Do not smoke, vape, or use nicotine  or tobacco. Contact a health care provider if: You lose weight because you can't swallow. You cough when you drink  liquids. You cough up partially digested food. Get help right away if: You can't swallow your saliva. You have shortness of breath, a fever, or both. Your voice is hoarse and you have trouble swallowing. These symptoms may be an emergency. Call 911 right away. Do not wait to see if the symptoms will go away. Do not drive yourself to the hospital. This information is not intended to replace advice given to you by your health care provider. Make sure you discuss any questions you have with your health care provider. Document Revised: 12/23/2022 Document Reviewed: 12/23/2022 Elsevier Patient Education  2025 [employer].

## 2023-12-23 NOTE — Progress Notes (Signed)
 Subjective:    Patient ID: Lynn Tapia, female    DOB: Apr 08, 1955, 68 y.o.   MRN: [id number]  HPI  Discussed the use of AI scribe software for clinical note transcription with the patient, who gave verbal consent to proceed.  Lynn Tapia is a 68 year old female who presents with difficulty swallowing.  She experiences difficulty swallowing, described as a sensation of food and liquids not wanting to go down, localized to the neck area rather than the chest. This occurs with both solids and liquids and happens frequently, though not daily. It does not cause choking or vomiting but is distressing. No heartburn or reflux symptoms are present.  She has a history of cervical radiculopathy, characterized by chronic neck pain with nerve impingement. She recently completed physical therapy and reports persistent neck soreness, especially with movement.  She had an x-ray and MRI of the cervical spine in 2023, results are in Care Everywhere.  She has not experienced significant numbness, tingling, or weakness in the upper extremities, though she occasionally wakes up with an arm 'asleep'.   Review of Systems   Past Medical History:  Diagnosis Date   Arthritis    Gout    Heart murmur    Hypertension    Internal hemorrhoids with complication 09/17/2015   Formatting of this note might be different from the original. Last Assessment & Plan: Formatting of this note might be different from the original. Long history of constipation and hemorrhoids s/p IRC.  Per patient, constipation is better but still has symptoms from hemorrhoids (anal and rectal pain sensitivity, occasional rectal bleeding, hemorrhoid prolapse).  Not responded to [facility]. Discussed con   Migraine    Mild aortic regurgitation    Non-alcoholic fatty liver disease    Vitamin D deficiency, unspecified     Current Outpatient Medications  Medication Sig Dispense Refill   acetaminophen  (TYLENOL ) 500 MG tablet Take 1,000 mg  by mouth every 6 (six) hours as needed.     acyclovir ointment (ZOVIRAX) 5 % Apply 1 Application topically every 3 (three) hours.     allopurinol  (ZYLOPRIM ) 100 MG tablet TAKE 2 TABLETS BY MOUTH EVERY DAY 180 tablet 1   amLODipine (NORVASC) 5 MG tablet Take 5 mg by mouth daily.     ascorbic acid (VITAMIN C) 1000 MG tablet Take 1,000 mg by mouth daily.     chlorthalidone (HYGROTON) 25 MG tablet TAKE 2 TABLETS BY MOUTH EVERY MORNING. 180 tablet 3   cholecalciferol (VITAMIN D3) 25 MCG (1000 UNIT) tablet Take 1,000 Units by mouth daily.     colchicine  0.6 MG tablet TAKE 1 TABLET BY MOUTH 2 TIMES DAILY. (Patient taking differently: Take 0.6 mg by mouth as needed.) 180 tablet 1   cyanocobalamin  (VITAMIN B12) 1000 MCG tablet Take 1,000 mcg by mouth daily.     cyclobenzaprine  (FLEXERIL ) 5 MG tablet Take 1 tablet (5 mg total) by mouth at bedtime. 30 tablet 0   docusate sodium (COLACE) 100 MG capsule Take 100 mg by mouth 2 (two) times daily.     fluocinonide cream (LIDEX) 0.05 % Apply 1 Application topically 2 (two) times daily.     fluticasone  (FLONASE ) 50 MCG/ACT nasal spray Place 2 sprays into both nostrils daily. 16 g 3   ibuprofen (ADVIL) 200 MG tablet Take 200 mg by mouth every 6 (six) hours as needed.     KLOR-CON M10 10 MEQ tablet TAKE 2 TABLETS BY MOUTH DAILY 180 tablet 3  losartan  (COZAAR ) 25 MG tablet Take 1 tablet (25 mg total) by mouth daily. 90 tablet 1   magnesium oxide (MAG-OX) 400 (240 Mg) MG tablet Take 400 mg by mouth daily.     polyethylene glycol (MIRALAX / GLYCOLAX) 17 g packet Take 17 g by mouth daily.     SUMAtriptan  (IMITREX ) 50 MG tablet Take 1 tablet (50 mg total) by mouth every 2 (two) hours as needed for migraine. May repeat in 2 hours if headache persists or recurs. 10 tablet 3   triamcinolone cream (KENALOG) 0.1 % Apply 1 Application topically 2 (two) times daily.     No current facility-administered medications for this visit.    Allergies  Allergen Reactions    Penicillins     Reports this happened at 67 months old    Sulfa Antibiotics Rash   Lisinopril Cough    lisinopril   Telmisartan Other (See Comments)    Patient reports nightmares     Family History  Problem Relation Age of Onset   Breast cancer Mother 38   CAD Mother 77       MI during her breast cancer   Diabetes Mother    Cancer Mother 39       Breast, mastectomy, ultimately metastatic and caused her death   Cancer Father 59       Colon   Hypertension Father    Allergies Father    COPD Sister 77       Smoker   Asthma Sister    Diabetes Sister    Cancer Brother 93       Lung   Stroke Maternal Grandmother        Cerebral hemorrhage   CAD Maternal Grandfather 77   Emphysema Paternal Grandmother     Social History   Socioeconomic History   Marital status: Married    Spouse name: Sudie   Number of children: 1   Years of education: Not on file   Highest education level: GED or equivalent  Occupational History   Occupation: retired  Tobacco Use   Smoking status: Never    Passive exposure: Past   Smokeless tobacco: Never  Vaping Use   Vaping status: Never Used  Substance and Sexual Activity   Alcohol use: Never   Drug use: Never   Sexual activity: Yes    Birth control/protection: None  Other Topics Concern   Not on file  Social History Narrative   1 biological daughter and 1 step-daughter   Social Drivers of Health   Financial Resource Strain: Low Risk  (11/20/2023)   Overall Financial Resource Strain (CARDIA)    Difficulty of Paying Living Expenses: Not hard at all  Food Insecurity: No Food Insecurity (11/20/2023)   Hunger Vital Sign    Worried About Running Out of Food in the Last Year: Never true    Ran Out of Food in the Last Year: Never true  Transportation Needs: No Transportation Needs (11/20/2023)   PRAPARE - Administrator, Civil Service (Medical): No    Lack of Transportation (Non-Medical): No  Physical Activity: Inactive  (11/20/2023)   Exercise Vital Sign    Days of Exercise per Week: 0 days    Minutes of Exercise per Session: Not on file  Stress: No Stress Concern Present (11/20/2023)   [employer] of Occupational Health - Occupational Stress Questionnaire    Feeling of Stress: Not at all  Social Connections: Socially Integrated (11/20/2023)   Social Connection and Isolation  Panel    Frequency of Communication with Friends and Family: More than three times a week    Frequency of Social Gatherings with Friends and Family: More than three times a week    Attends Religious Services: More than 4 times per year    Active Member of [employer] or Organizations: Yes    Attends Banker Meetings: 1 to 4 times per year    Marital Status: Married  Catering Manager Violence: Not At Risk (06/09/2023)   Humiliation, Afraid, Rape, and Kick questionnaire    Fear of Current or Ex-Partner: No    Emotionally Abused: No    Physically Abused: No    Sexually Abused: No     Constitutional: Patient reports intermittent headaches.  Denies fever, malaise, fatigue, or abrupt weight changes.  HEENT: Denies eye pain, eye redness, ear pain, ringing in the ears, wax buildup, runny nose, nasal congestion, bloody nose, or sore throat. Respiratory: Denies difficulty breathing, shortness of breath, cough or sputum production.   Cardiovascular: Denies chest pain, chest tightness, palpitations or swelling in the hands or feet.  Gastrointestinal: Patient reports difficulty swallowing, constipation.  Denies abdominal pain, bloating, diarrhea or blood in the stool.  GU: Denies urgency, frequency, pain with urination, burning sensation, blood in urine, odor or discharge. Musculoskeletal: Patient reports chronic neck pain.  Denies decrease in range of motion, difficulty with gait, muscle pain or joint swelling.  Skin: Denies redness, rashes, lesions or ulcercations.  Neurological: Patient reports intermittent paresthesia of upper  extremities.  Denies dizziness, difficulty with memory, difficulty with speech or problems with balance and coordination.  Psych: Denies anxiety, depression, SI/HI.  No other specific complaints in a complete review of systems (except as listed in HPI above).      Objective:   Physical Exam BP 124/74 (BP Location: Left Arm, Patient Position: Sitting, Cuff Size: Normal)   Ht 5' 7 (1.702 m)   Wt 215 lb 6.4 oz (97.7 kg)   BMI 33.74 kg/m    Wt Readings from Last 3 Encounters:  11/24/23 218 lb 12.8 oz (99.2 kg)  10/24/23 219 lb (99.3 kg)  10/11/23 212 lb (96.2 kg)    General: Appears her stated age, obese, in NAD. Cardiovascular: Normal rate and rhythm. S1,S2 noted.  Murmur noted.  Pulmonary/Chest: Normal effort and positive vesicular breath sounds. No respiratory distress. No wheezes, rales or ronchi noted.  Abdomen: Normal bowel sounds.  Musculoskeletal: Normal flexion, extension, rotation and lateral bending of the spine.  Bony tenderness noted over the cervical spine.  Shoulder shrug equal.  No difficulty with gait.  Neurological: Alert and oriented. Coordination normal.    BMET    Component Value Date/Time   NA 141 11/24/2023 1048   K 3.9 11/24/2023 1048   CL 95 (L) 11/24/2023 1048   CO2 27 11/24/2023 1048   GLUCOSE 100 (H) 11/24/2023 1048   GLUCOSE 138 (H) 10/13/2022 1410   BUN 19 11/24/2023 1048   CREATININE 0.91 11/24/2023 1048   CALCIUM  9.2 11/24/2023 1048   GFRNONAA >60 10/13/2022 1410    Lipid Panel     Component Value Date/Time   CHOL 198 11/24/2023 1048   TRIG 180 (H) 11/24/2023 1048   HDL 42 11/24/2023 1048   CHOLHDL 4.7 (H) 11/24/2023 1048   LDLCALC 124 (H) 11/24/2023 1048    CBC    Component Value Date/Time   WBC 9.1 11/24/2023 1048   RBC 4.35 11/24/2023 1048   HGB 13.6 11/24/2023 1048   HCT  40.8 11/24/2023 1048   PLT 249 11/24/2023 1048   MCV 94 11/24/2023 1048   MCH 31.3 11/24/2023 1048   MCHC 33.3 11/24/2023 1048   RDW 12.1 11/24/2023  1048   LYMPHSABS 2.8 10/27/2022 1054   EOSABS 0.3 10/27/2022 1054   BASOSABS 0.0 10/27/2022 1054    Hgb A1C Lab Results  Component Value Date   HGBA1C 6.3 (H) 11/24/2023            Assessment & Plan:   Assessment and Plan    Dysphagia, oropharyngeal phase Intermittent dysphagia with sensation of food and liquids sticking in the throat. No choking, vomiting, heartburn, or reflux. Differential includes esophageal stricture or acid reflux. Symptoms suggest oropharyngeal phase dysphagia. - Prescribed omeprazole 40 mg once daily for 30 days. - Referred to GI specialist for evaluation and potential barium swallow or upper endoscopy.     Cervical radiculopathy Persistent pain and stiffness despite recent completion of physical therapy.  She does not desire surgical intervention - Encouraged regular stretching - Continue cyclobenzaprine  as previously prescribed - She will continue to follow-up with orthopedics/neurosurgery  RTC in 5 months for your annual exam Angeline Laura, NP

## 2024-01-17 DIAGNOSIS — K5909 Other constipation: Secondary | ICD-10-CM | POA: Diagnosis not present

## 2024-01-17 DIAGNOSIS — R1319 Other dysphagia: Secondary | ICD-10-CM | POA: Diagnosis not present

## 2024-02-10 ENCOUNTER — Encounter
Admission: RE | Disposition: A | Discharge status: Home / Self Care | Payer: Self-pay | Source: Home / Self Care | Attending: Gastroenterology

## 2024-02-10 ENCOUNTER — Ambulatory Visit
Admission: RE | Admit: 2024-02-10 | Discharge: 2024-02-10 | Disposition: A | Discharge status: Home / Self Care | Attending: Gastroenterology | Admitting: Gastroenterology

## 2024-02-10 ENCOUNTER — Ambulatory Visit: Admitting: Anesthesiology

## 2024-02-10 ENCOUNTER — Other Ambulatory Visit: Payer: Self-pay

## 2024-02-10 ENCOUNTER — Encounter: Payer: Self-pay | Admitting: Gastroenterology

## 2024-02-10 DIAGNOSIS — I10 Essential (primary) hypertension: Secondary | ICD-10-CM | POA: Diagnosis not present

## 2024-02-10 DIAGNOSIS — K3189 Other diseases of stomach and duodenum: Secondary | ICD-10-CM | POA: Diagnosis not present

## 2024-02-10 DIAGNOSIS — K219 Gastro-esophageal reflux disease without esophagitis: Secondary | ICD-10-CM | POA: Insufficient documentation

## 2024-02-10 DIAGNOSIS — R1314 Dysphagia, pharyngoesophageal phase: Secondary | ICD-10-CM | POA: Insufficient documentation

## 2024-02-10 DIAGNOSIS — Z79899 Other long term (current) drug therapy: Secondary | ICD-10-CM | POA: Diagnosis not present

## 2024-02-10 HISTORY — PX: BIOPSY OF SKIN SUBCUTANEOUS TISSUE AND/OR MUCOUS MEMBRANE: SHX6741

## 2024-02-10 HISTORY — PX: ESOPHAGOGASTRODUODENOSCOPY: SHX5428

## 2024-02-10 SURGERY — EGD (ESOPHAGOGASTRODUODENOSCOPY)
Anesthesia: General

## 2024-02-10 MED ORDER — SODIUM CHLORIDE 0.9 % IV SOLN: INTRAVENOUS | Status: DC

## 2024-02-10 MED ORDER — PROPOFOL 10 MG/ML IV BOLUS
INTRAVENOUS | Status: DC | PRN
  Administered 2024-02-10: 30 mg via INTRAVENOUS
  Administered 2024-02-10: 150 mg via INTRAVENOUS

## 2024-02-10 NOTE — H&P (Signed)
 "   Lynn JONELLE Brooklyn, MD [facility], [address]  [place], [place] [address]  Main: [phone] Fax:  [phone] Pager: [phone]   Primary Care Physician:  Antonette Angeline ORN, NP Primary Gastroenterologist:  Dr. Corinn JONELLE Tapia  Pre-Procedure History & Physical: HPI:  Lynn Tapia is a 69 y.o. female is here for an endoscopy.   Past Medical History:  Diagnosis Date   Arthritis    Gout    Heart murmur    Hypertension    Internal hemorrhoids with complication 09/17/2015   Formatting of this note might be different from the original. Last Assessment & Plan: Formatting of this note might be different from the original. Long history of constipation and hemorrhoids s/p IRC.  Per patient, constipation is better but still has symptoms from hemorrhoids (anal and rectal pain sensitivity, occasional rectal bleeding, hemorrhoid prolapse).  Not responded to [facility]. Discussed con   Migraine    Mild aortic regurgitation    Non-alcoholic fatty liver disease    Vitamin D deficiency, unspecified     Past Surgical History:  Procedure Laterality Date   ABDOMINAL HYSTERECTOMY  03/2008   ANUS SURGERY  12/2018   CATARACT EXTRACTION W/PHACO Left 03/02/2023   Procedure: CATARACT EXTRACTION PHACO AND INTRAOCULAR LENS PLACEMENT (IOC) LEFT CLAREON VIVITY 3.52 00:19.1;  Surgeon: Mittie Gaskin, MD;  Location: [facility];  Service: Ophthalmology;  Laterality: Left;   CATARACT EXTRACTION W/PHACO Right 03/16/2023   Procedure: CATARACT EXTRACTION PHACO AND INTRAOCULAR LENS PLACEMENT (IOC) RIGHT  CLAREON VIVITY TORIC 2.62 00:20.0;  Surgeon: Mittie Gaskin, MD;  Location: [facility];  Service: Ophthalmology;  Laterality: Right;   CHOLECYSTECTOMY  2002   at [facility] ARTHROSCOPY WITH SUBACROMIAL DECOMPRESSION, ROTATOR CUFF REPAIR AND BICEP TENDON REPAIR Right 02/11/2022   Procedure: SHOULDER ARTHROSCOPY WITH DEBRIDEMENT, DECOMPRESSION, ROTATOR CUFF  REPAIR AND BIPEPS TENODESIS.;  Surgeon: Edie Norleen PARAS, MD;  Location: [facility];  Service: Orthopedics;  Laterality: Right;    Prior to Admission medications  Medication Sig Start Date End Date Taking? Authorizing Provider  amLODipine (NORVASC) 5 MG tablet Take 5 mg by mouth daily.   Yes [provider]  chlorthalidone (HYGROTON) 25 MG tablet TAKE 2 TABLETS BY MOUTH EVERY MORNING. 05/04/23  Yes Justus Leita DEL, MD  losartan  (COZAAR ) 25 MG tablet Take 1 tablet (25 mg total) by mouth daily. 11/24/23  Yes Baity, Angeline ORN, NP  acetaminophen  (TYLENOL ) 500 MG tablet Take 1,000 mg by mouth every 6 (six) hours as needed.    [provider]  acyclovir ointment (ZOVIRAX) 5 % Apply 1 Application topically every 3 (three) hours.    [provider]  allopurinol  (ZYLOPRIM ) 100 MG tablet TAKE 2 TABLETS BY MOUTH EVERY DAY 10/27/23   Berglund, Laura H, MD  ascorbic acid (VITAMIN C) 1000 MG tablet Take 1,000 mg by mouth daily.    [provider]  cholecalciferol (VITAMIN D3) 25 MCG (1000 UNIT) tablet Take 1,000 Units by mouth daily.    [provider]  colchicine  0.6 MG tablet TAKE 1 TABLET BY MOUTH 2 TIMES DAILY. Patient taking differently: Take 0.6 mg by mouth as needed. 09/09/22   Justus Leita DEL, MD  cyanocobalamin  (VITAMIN B12) 1000 MCG tablet Take 1,000 mcg by mouth daily.    [provider]  cyclobenzaprine  (FLEXERIL ) 5 MG tablet Take 1 tablet (5 mg total) by mouth at bedtime. 10/24/23   Justus Leita DEL, MD  docusate sodium (COLACE) 100 MG  capsule Take 100 mg by mouth 2 (two) times daily.    [provider]  fluocinonide cream (LIDEX) 0.05 % Apply 1 Application topically 2 (two) times daily.    [provider]  fluticasone  (FLONASE ) 50 MCG/ACT nasal spray Place 2 sprays into both nostrils daily. 05/04/23   Berglund, Laura H, MD  ibuprofen (ADVIL) 200 MG tablet Take 200 mg by mouth every 6 (six) hours as needed.    [provider]  KLOR-CON M10 10 MEQ tablet TAKE 2 TABLETS BY MOUTH DAILY 05/04/23   Berglund, Laura H, MD  magnesium oxide (MAG-OX) 400 (240 Mg) MG tablet Take 400 mg by mouth daily.    [provider]  omeprazole  (PRILOSEC) 40 MG capsule Take 1 capsule (40 mg total) by mouth daily. 12/23/23   Antonette Angeline ORN, NP  polyethylene glycol (MIRALAX / GLYCOLAX) 17 g packet Take 17 g by mouth daily.    [provider]  SUMAtriptan  (IMITREX ) 50 MG tablet Take 1 tablet (50 mg total) by mouth every 2 (two) hours as needed for migraine. May repeat in 2 hours if headache persists or recurs. 10/08/22   Berglund, Laura H, MD  triamcinolone cream (KENALOG) 0.1 % Apply 1 Application topically 2 (two) times daily.    [provider]    Allergies as of 01/17/2024 - Review Complete 12/23/2023  Allergen Reaction Noted   Penicillins  02/03/2022   Sulfa antibiotics Rash 02/03/2022   Lisinopril Cough 09/21/2021   Telmisartan Other (See Comments) 02/11/2022    Family History  Problem Relation Age of Onset   Breast cancer Mother 27   CAD Mother 57       MI during her breast cancer   Diabetes Mother    Cancer Mother 61       Breast, mastectomy, ultimately metastatic and caused her death   Cancer Father 87       Colon   Hypertension Father    Allergies Father    COPD Sister 74       Smoker   Asthma Sister    Diabetes Sister    Cancer Brother 46       Lung   Stroke Maternal Grandmother        Cerebral hemorrhage   CAD Maternal Grandfather 59   Emphysema Paternal Grandmother     Social History   Socioeconomic History   Marital status: Married    Spouse name: Sudie   Number of children: 1   Years of education: Not on file   Highest education level: GED or equivalent  Occupational History   Occupation: retired  Tobacco Use   Smoking status: Never    Passive exposure: Past   Smokeless tobacco: Never  Vaping Use   Vaping status: Never Used  Substance and Sexual Activity    Alcohol use: Never   Drug use: Never   Sexual activity: Yes    Birth control/protection: None  Other Topics Concern   Not on file  Social History Narrative   1 biological daughter and 1 step-daughter   Social Drivers of Health   Tobacco Use: Low Risk  (01/11/2024)   Received from [facility]   Patient History    Smoking Tobacco Use: Never    Smokeless Tobacco Use: Never    Passive Exposure: Not on file  Financial Resource Strain: Low Risk  (01/17/2024)   [facility]   Overall Financial Resource Strain (CARDIA)    Difficulty  of Paying Living Expenses: Not hard at all  Food Insecurity: No Food Insecurity (01/17/2024)   Received from [facility]   Epic    Within the past 12 months, you worried that your food would run out before you got the money to buy more.: Never true    Within the past 12 months, the food you bought just didn't last and you didn't have money to get more.: Never true  Transportation Needs: No Transportation Needs (01/17/2024)   Received from [facility]    In the past 12 months, has lack of transportation kept you from medical appointments or from getting medications?: No    Lack of Transportation (Non-Medical): No  Physical Activity: Inactive (11/20/2023)   Exercise Vital Sign    Days of Exercise per Week: 0 days    Minutes of Exercise per Session: Not on file  Stress: No Stress Concern Present (11/20/2023)   [employer] of Occupational Health - Occupational Stress Questionnaire    Feeling of Stress: Not at all  Social Connections: Socially Integrated (11/20/2023)   Social Connection and Isolation Panel    Frequency of Communication with Friends and Family: More than three times a week    Frequency of Social Gatherings with Friends and Family: More than three times a week    Attends Religious Services: More than 4 times per year    Active  Member of [employer] or Organizations: Yes    Attends Banker Meetings: 1 to 4 times per year    Marital Status: Married  Catering Manager Violence: Not At Risk (06/09/2023)   Humiliation, Afraid, Rape, and Kick questionnaire    Fear of Current or Ex-Partner: No    Emotionally Abused: No    Physically Abused: No    Sexually Abused: No  Depression (PHQ2-9): Low Risk (12/23/2023)   Depression (PHQ2-9)    PHQ-2 Score: 0  Alcohol Screen: Low Risk (06/09/2023)   Alcohol Screen    Last Alcohol Screening Score (AUDIT): 0  Housing: Low Risk  (01/17/2024)   Received from [facility]   Epic    In the last 12 months, was there a time when you were not able to pay the mortgage or rent on time?: No    In the past 12 months, how many times have you moved where you were living?: 0    At any time in the past 12 months, were you homeless or living in a shelter (including now)?: No  Utilities: Not At Risk (01/17/2024)   Received from [facility]   Epic    In the past 12 months has the electric, gas, oil, or water company threatened to shut off services in your home?: No  Health Literacy: Adequate Health Literacy (06/09/2023)   B1300 Health Literacy    Frequency of need for help with medical instructions: Never    Review of Systems: See HPI, otherwise negative ROS  Physical Exam: BP 138/80   Pulse 73   Temp (!) 96.4 F (35.8 C) (Tympanic)   Resp 20   Ht 5' 7 (1.702 m)   Wt 97.8 kg   SpO2 95%   BMI 33.77 kg/m  General:   Alert,  pleasant and cooperative in NAD Head:  Normocephalic and atraumatic. Neck:  Supple; no masses or thyromegaly. Lungs:  Clear throughout to auscultation.    Heart:  Regular rate and rhythm. Abdomen:  Soft, nontender and nondistended.  Normal bowel sounds, without guarding, and without rebound.   Neurologic:  Alert and  oriented x4;  grossly normal neurologically.  Impression/Plan: Tisa D Goers is here for an endoscopy to be  performed for dysphagia  Risks, benefits, limitations, and alternatives regarding  endoscopy have been reviewed with the patient.  Questions have been answered.  All parties agreeable.   Lynn Brooklyn, MD  02/10/2024, 10:50 AM "

## 2024-02-10 NOTE — Anesthesia Postprocedure Evaluation (Signed)
"   Anesthesia Post Note  Patient: Lynn Tapia  Procedure(s) Performed: EGD (ESOPHAGOGASTRODUODENOSCOPY) BIOPSY, GI  Patient location during evaluation: PACU Anesthesia Type: General Level of consciousness: awake Pain management: pain level controlled Vital Signs Assessment: post-procedure vital signs reviewed and stable Respiratory status: spontaneous breathing and nonlabored ventilation Cardiovascular status: stable Anesthetic complications: no   No notable events documented.   Last Vitals:  Vitals:   02/10/24 1222 02/10/24 1232  BP: 111/66 116/64  Pulse: 63 61  Resp: 20 12  Temp:    SpO2: 98% 99%    Last Pain:  Vitals:   02/10/24 1232  TempSrc: Tympanic  PainSc: 0-No pain                 VAN STAVEREN,Charliegh Vasudevan      "

## 2024-02-10 NOTE — Transfer of Care (Signed)
 Immediate Anesthesia Transfer of Care Note  Patient: Lynn Tapia  Procedure(s) Performed: EGD (ESOPHAGOGASTRODUODENOSCOPY)  Patient Location: PACU and Endoscopy Unit  Anesthesia Type:General  Level of Consciousness: sedated  Airway & Oxygen Therapy: Patient Spontanous Breathing  Post-op Assessment: Report given to RN  Post vital signs: Reviewed and stable  Last Vitals:  Vitals Value Taken Time  BP    Temp    Pulse    Resp    SpO2      Last Pain:  Vitals:   02/10/24 1044  TempSrc: Tympanic  PainSc: 0-No pain         Complications: No notable events documented.

## 2024-02-10 NOTE — Anesthesia Preprocedure Evaluation (Signed)
 "                                  Anesthesia Evaluation  Patient identified by MRN, date of birth, ID band Patient awake    Reviewed: Allergy & Precautions, NPO status , Patient's Chart, lab work & pertinent test results  Airway Mallampati: III  TM Distance: <3 FB Neck ROM: Full    Dental  (+) Teeth Intact   Pulmonary neg pulmonary ROS   Pulmonary exam normal        Cardiovascular Exercise Tolerance: Good hypertension, Pt. on medications negative cardio ROS Normal cardiovascular exam Rhythm:Regular Rate:Normal     Neuro/Psych negative neurological ROS  negative psych ROS   GI/Hepatic negative GI ROS, Neg liver ROS,GERD  Medicated,,  Endo/Other  negative endocrine ROS    Renal/GU negative Renal ROS  negative genitourinary   Musculoskeletal  (+) Arthritis ,    Abdominal  (+) + obese  Peds  Hematology negative hematology ROS (+)   Anesthesia Other Findings Past Medical History: No date: Arthritis No date: Gout No date: Heart murmur No date: Hypertension 09/17/2015: Internal hemorrhoids with complication     Comment:  Formatting of this note might be different from the               original. Last Assessment & Plan: Formatting of this note              might be different from the original. Long history of               constipation and hemorrhoids s/p IRC. Per patient,               constipation is better but still has symptoms from               hemorrhoids (anal and rectal pain sensitivity, occasional              rectal bleeding, hemorrhoid prolapse). Not responded to               [facility]. 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 No date: Vitamin D deficiency, unspecified  Past Surgical History: 03/2008: ABDOMINAL HYSTERECTOMY 12/2018: ANUS SURGERY 03/02/2023: CATARACT EXTRACTION W/PHACO; Left     Comment:  Procedure: CATARACT EXTRACTION PHACO AND INTRAOCULAR                LENS PLACEMENT (IOC) LEFT CLAREON VIVITY 3.52 00:19.1;                Surgeon: Mittie Gaskin, MD;  Location: [facility];  Service: Ophthalmology;  Laterality: Left; 03/16/2023: CATARACT EXTRACTION W/PHACO; Right     Comment:  Procedure: CATARACT EXTRACTION PHACO AND INTRAOCULAR               LENS PLACEMENT (IOC) RIGHT  CLAREON VIVITY TORIC 2.62               00:20.0;  Surgeon: Mittie Gaskin, MD;  Location:               [facility];  Service: Ophthalmology;                Laterality: Right; 2002: CHOLECYSTECTOMY     Comment:  at [facility] 02/11/2022: SHOULDER ARTHROSCOPY WITH SUBACROMIAL DECOMPRESSION,  ROTATOR  CUFF REPAIR AND BICEP TENDON REPAIR; Right     Comment:  Procedure: SHOULDER ARTHROSCOPY WITH DEBRIDEMENT,               DECOMPRESSION, ROTATOR CUFF REPAIR AND BIPEPS TENODESIS.;              Surgeon: Edie Norleen PARAS, MD;  Location: [facility];                Service: Orthopedics;  Laterality: Right;  BMI    Body Mass Index: 33.77 kg/m      Reproductive/Obstetrics negative OB ROS                              Anesthesia Physical Anesthesia Plan  ASA: 3  Anesthesia Plan: General   Post-op Pain Management:    Induction: Intravenous  PONV Risk Score and Plan: Propofol  infusion and TIVA  Airway Management Planned: Natural Airway and Nasal Cannula  Additional Equipment:   Intra-op Plan:   Post-operative Plan:   Informed Consent: I have reviewed the patients History and Physical, chart, labs and discussed the procedure including the risks, benefits and alternatives for the proposed anesthesia with the patient or authorized representative who has indicated his/her understanding and acceptance.     Dental Advisory Given  Plan Discussed with: CRNA  Anesthesia Plan Comments:         Anesthesia Quick Evaluation  "

## 2024-02-10 NOTE — Op Note (Signed)
 [facility] Gastroenterology Patient Name: Lynn Tapia Procedure Date: 02/10/2024 11:54 AM MRN: [id number] Account #: [id number] Date of Birth: January 22, 1956 Admit Type: Outpatient Age: 69 Room: [facility] ENDO ROOM 1 Gender: Female Note Status: Finalized Instrument Name: Endoscope [id number] Procedure:             Upper GI endoscopy Indications:           Dysphagia Providers:             Corinn Jess Brooklyn MD, MD Referring MD:          Corinn Jess Brooklyn MD, MD (Referring MD), Angeline MICAEL Laura (Referring MD) Medicines:             General Anesthesia Complications:         No immediate complications. Estimated blood loss: None. Procedure:             Pre-Anesthesia Assessment:                        - Prior to the procedure, a History and Physical was                         performed, and patient medications and allergies were                         reviewed. The patient is competent. The risks and                         benefits of the procedure and the sedation options and                         risks were discussed with the patient. All questions                         were answered and informed consent was obtained.                         Patient identification and proposed procedure were                         verified by the physician, the nurse, the                         anesthesiologist, the anesthetist and the technician                         in the pre-procedure area in the procedure room in the                         endoscopy suite. Mental Status Examination: alert and                         oriented. Airway Examination: normal oropharyngeal                         airway and neck mobility. Respiratory Examination:  clear to auscultation. CV Examination: normal.                         Prophylactic Antibiotics: The patient does not require                         prophylactic antibiotics. Prior  Anticoagulants: The                         patient has taken no anticoagulant or antiplatelet                         agents. ASA Grade Assessment: III - A patient with                         severe systemic disease. After reviewing the risks and                         benefits, the patient was deemed in satisfactory                         condition to undergo the procedure. The anesthesia                         plan was to use general anesthesia. Immediately prior                         to administration of medications, the patient was                         re-assessed for adequacy to receive sedatives. The                         heart rate, respiratory rate, oxygen saturations,                         blood pressure, adequacy of pulmonary ventilation, and                         response to care were monitored throughout the                         procedure. The physical status of the patient was                         re-assessed after the procedure.                        After obtaining informed consent, the endoscope was                         passed under direct vision. Throughout the procedure,                         the patient's blood pressure, pulse, and oxygen                         saturations were monitored continuously. The Endoscope  was introduced through the mouth, and advanced to the                         second part of duodenum. The upper GI endoscopy was                         accomplished without difficulty. The patient tolerated                         the procedure well. Findings:      The duodenal bulb and second portion of the duodenum were normal.      Patchy mildly erythematous mucosa without bleeding was found in the       gastric antrum. Biopsies were taken with a cold forceps for Helicobacter       pylori testing.      The gastric fundus, gastric body and incisura were normal. Biopsies were       taken with a cold  forceps for histology.      The cardia and gastric fundus were normal on retroflexion.      Esophagogastric landmarks were identified: the gastroesophageal junction       was found at 40 cm from the incisors.      The gastroesophageal junction and examined esophagus were normal.       Biopsies were taken with a cold forceps for histology. Impression:            - Normal duodenal bulb and second portion of the                         duodenum.                        - Erythematous mucosa in the antrum. Biopsied.                        - Normal gastric fundus, gastric body and incisura.                         Biopsied.                        - Esophagogastric landmarks identified.                        - Normal gastroesophageal junction and esophagus.                         Biopsied. Recommendation:        - Await pathology results.                        - Discharge patient to home (with escort).                        - Resume previous diet today.                        - Continue present medications. Procedure Code(s):     --- Professional ---                        [phone], Esophagogastroduodenoscopy, flexible,  transoral; with biopsy, single or multiple Diagnosis Code(s):     --- Professional ---                        K31.89, Other diseases of stomach and duodenum                        R13.10, Dysphagia, unspecified CPT copyright 2022 [employer]. All rights reserved. The codes documented in this report are preliminary and upon coder review may  be revised to meet current compliance requirements. Dr. Corinn Brooklyn Corinn Jess Brooklyn MD, MD 02/10/2024 12:16:46 PM This report has been signed electronically. Number of Addenda: 0 Note Initiated On: 02/10/2024 11:54 AM Estimated Blood Loss:  Estimated blood loss: none.      [facility]

## 2024-02-13 LAB — SURGICAL PATHOLOGY

## 2024-02-16 ENCOUNTER — Ambulatory Visit: Payer: Self-pay | Admitting: Gastroenterology

## 2024-02-23 ENCOUNTER — Encounter: Admitting: Internal Medicine

## 2024-05-24 ENCOUNTER — Encounter: Admitting: Internal Medicine

## 2024-06-21 ENCOUNTER — Ambulatory Visit
